# Patient Record
Sex: Female | Born: 1995 | Race: White | Hispanic: No | Marital: Single | Smoking: Never smoker
Health system: Southern US, Community
[De-identification: ages and names within clinical notes are randomized; demographics above are authoritative.]

## PROBLEM LIST (undated history)

## (undated) DIAGNOSIS — F419 Anxiety disorder, unspecified: Secondary | ICD-10-CM

## (undated) DIAGNOSIS — T7840XA Allergy, unspecified, initial encounter: Secondary | ICD-10-CM

## (undated) DIAGNOSIS — F909 Attention-deficit hyperactivity disorder, unspecified type: Secondary | ICD-10-CM

## (undated) DIAGNOSIS — F32A Depression, unspecified: Secondary | ICD-10-CM

## (undated) HISTORY — DX: Allergy, unspecified, initial encounter: T78.40XA

## (undated) HISTORY — DX: Depression, unspecified: F32.A

## (undated) HISTORY — DX: Anxiety disorder, unspecified: F41.9

## (undated) HISTORY — DX: Attention-deficit hyperactivity disorder, unspecified type: F90.9

## (undated) HISTORY — PX: TYMPANOSTOMY TUBE PLACEMENT: SHX32

---
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
## 2017-02-04 NOTE — Progress Notes (Signed)
Chief Complaint  Patient presents with  . Medication Refill    HPI   ADD Pt has a diagnosis of ADD/ADHD She is taking 5 daily and has good results She does not use other stimulants such as energy drinks or caffeine She is able to sleep at night and denies unintentional weight loss She denies mood swings She skips doses when not working or on weekends Wt Readings from Last 3 Encounters:  02/05/17 160 lb 6.4 oz (72.8 kg) (88 %, Z= 1.18)*  12/10/16 155 lb (70.3 kg) (85 %, Z= 1.05)*  09/29/16 149 lb 1.6 oz (67.6 kg) (81 %, Z= 0.89)*   * Growth percentiles are based on CDC (Girls, 2-20 Years) data.   Adjustment disorder with mixed anxiety and depressed mood She is also taking Prozac She takes prozac for anxiety She reports that her dose of 20mg  is working for her She does not exercise  She feel off her horse and crushed T8-L1 vertebrae    Past Medical History:  Diagnosis Date  . ADHD (attention deficit hyperactivity disorder)     Current Outpatient Medications  Medication Sig Dispense Refill  . FLUoxetine (PROZAC) 20 MG capsule Take 1 capsule (20 mg total) by mouth daily. 30 capsule 2  . methylphenidate (CONCERTA) 36 MG PO CR tablet Take 2 tablets (72 mg total) by mouth daily. 60 tablet 0  . fluticasone (FLONASE) 50 MCG/ACT nasal spray Place 1 spray into both nostrils daily as needed for allergies or rhinitis.     No current facility-administered medications for this visit.     Allergies: No Known Allergies  No past surgical history on file.  Social History   Socioeconomic History  . Marital status: Single    Spouse name: None  . Number of children: None  . Years of education: None  . Highest education level: None  Social Needs  . Financial resource strain: None  . Food insecurity - worry: None  . Food insecurity - inability: None  . Transportation needs - medical: None  . Transportation needs - non-medical: None  Occupational History  . None  Tobacco Use    . Smoking status: Never Smoker  . Smokeless tobacco: Never Used  Substance and Sexual Activity  . Alcohol use: No  . Drug use: No  . Sexual activity: No  Other Topics Concern  . None  Social History Narrative  . None    No family history on file.   ROS Review of Systems See HPI Constitution: No fevers or chills No malaise No diaphoresis Skin: No rash or itching Eyes: no blurry vision, no double vision GU: no dysuria or hematuria Neuro: no dizziness or headaches * all others reviewed and negative   Objective: Vitals:   02/05/17 1500 02/05/17 1533  BP: 112/72   Pulse: (!) 112 98  Resp: 16   Temp: 98.6 F (37 C)   TempSrc: Oral   SpO2: 98%   Weight: 160 lb 6.4 oz (72.8 kg)   Height: 5' 4.89" (1.648 m)     Physical Exam  Constitutional: She is oriented to person, place, and time. She appears well-developed and well-nourished.  Cardiovascular: Regular rhythm, normal heart sounds and intact distal pulses. Tachycardia present.  No murmur heard. Pulmonary/Chest: Effort normal and breath sounds normal. No stridor. No respiratory distress.  Musculoskeletal: Normal range of motion. She exhibits no edema.  Neurological: She is alert and oriented to person, place, and time.    Assessment and Plan Shalanda was seen today  for medication refill.  Diagnoses and all orders for this visit:  Attention deficit disorder (ADD) without hyperactivity - refilled meds -     methylphenidate (CONCERTA) 36 MG PO CR tablet; Take 2 tablets (72 mg total) by mouth daily.  Adjustment disorder with mixed anxiety and depressed mood Refilled meds -     FLUoxetine (PROZAC) 20 MG capsule; Take 1 capsule (20 mg total) by mouth daily.     Dakotah Heiman A Addisen Chappelle

## 2017-02-05 ENCOUNTER — Ambulatory Visit (INDEPENDENT_AMBULATORY_CARE_PROVIDER_SITE_OTHER): Payer: Managed Care, Other (non HMO) | Admitting: Family Medicine

## 2017-02-05 ENCOUNTER — Encounter: Payer: Self-pay | Admitting: Family Medicine

## 2017-02-05 VITALS — BP 112/72 | HR 98 | Temp 98.6°F | Resp 16 | Ht 64.89 in | Wt 160.4 lb

## 2017-02-05 DIAGNOSIS — F988 Other specified behavioral and emotional disorders with onset usually occurring in childhood and adolescence: Secondary | ICD-10-CM | POA: Diagnosis not present

## 2017-02-05 DIAGNOSIS — F4323 Adjustment disorder with mixed anxiety and depressed mood: Secondary | ICD-10-CM | POA: Diagnosis not present

## 2017-02-05 MED ORDER — FLUOXETINE HCL 20 MG PO CAPS: 20.0000 mg | ORAL_CAPSULE | Freq: Every day | ORAL | 2 refills | Status: DC

## 2017-02-05 MED ORDER — METHYLPHENIDATE HCL ER (OSM) 36 MG PO TBCR: 72.0000 mg | EXTENDED_RELEASE_TABLET | Freq: Every day | ORAL | 0 refills | Status: DC

## 2017-02-05 NOTE — Patient Instructions (Signed)
     IF you received an x-ray today, you will receive an invoice from [facility]. Please contact  Radiology at [phone] with questions or concerns regarding your invoice.   IF you received labwork today, you will receive an invoice from [facility]. Please contact [facility] at [phone] with questions or concerns regarding your invoice.   Our billing staff will not be able to assist you with questions regarding bills from these companies.  You will be contacted with the lab results as soon as they are available. The fastest way to get your results is to activate your My Chart account. Instructions are located on the last page of this paperwork. If you have not heard from us regarding the results in 2 weeks, please contact this office.     

## 2017-04-07 ENCOUNTER — Telehealth: Payer: Self-pay | Admitting: Family Medicine

## 2017-04-07 DIAGNOSIS — F988 Other specified behavioral and emotional disorders with onset usually occurring in childhood and adolescence: Secondary | ICD-10-CM

## 2017-04-07 NOTE — Telephone Encounter (Signed)
Routing to provider  

## 2017-04-07 NOTE — Telephone Encounter (Signed)
Copied from CRM [phone]. Topic: Quick Communication - Rx Refill/Question >> Apr 07, 2017 11:03 AM Clack, Princella PellegriniJessica D wrote: Has the patient contacted their pharmacy? No.   (Agent: If no, request that the patient contact the pharmacy for the refill.)   Preferred Pharmacy (with phone number or street name): [id number] [place] - [id number] [facility] [address] [phone]) [phone])  Pt mother Aniceto Bosslma calling requesting a med refill on the pt  methylphenidate (CONCERTA) 36 MG PO CR tablet [[id number]][[id number]]    Agent: Please be advised that RX refills may take up to 3 business days. We ask that you follow-up with your pharmacy.

## 2017-04-08 HISTORY — PX: WISDOM TOOTH EXTRACTION: SHX21

## 2017-04-09 ENCOUNTER — Other Ambulatory Visit: Payer: Self-pay

## 2017-04-09 DIAGNOSIS — F988 Other specified behavioral and emotional disorders with onset usually occurring in childhood and adolescence: Secondary | ICD-10-CM

## 2017-04-09 NOTE — Telephone Encounter (Signed)
PEC req for refill  Concerta - Sent to Dr. Creta LevinStallings

## 2017-04-09 NOTE — Telephone Encounter (Signed)
Sent to Dr. Creta LevinStallings

## 2017-04-11 MED ORDER — METHYLPHENIDATE HCL ER (OSM) 36 MG PO TBCR: 72.0000 mg | EXTENDED_RELEASE_TABLET | Freq: Every day | ORAL | 0 refills | Status: DC

## 2017-04-11 NOTE — Telephone Encounter (Signed)
Refill printed and handed to patient today

## 2017-05-03 ENCOUNTER — Other Ambulatory Visit: Payer: Self-pay | Admitting: Family Medicine

## 2017-05-08 NOTE — Progress Notes (Deleted)
  No chief complaint on file.   HPI  4 review of systems  Past Medical History:  Diagnosis Date  . ADHD (attention deficit hyperactivity disorder)     Current Outpatient Medications  Medication Sig Dispense Refill  . FLUoxetine (PROZAC) 20 MG capsule TAKE 1 CAPSULE(20 MG) BY MOUTH DAILY 90 capsule 0  . fluticasone (FLONASE) 50 MCG/ACT nasal spray Place 1 spray into both nostrils daily as needed for allergies or rhinitis.    . methylphenidate (CONCERTA) 36 MG PO CR tablet Take 2 tablets (72 mg total) by mouth daily. 60 tablet 0   No current facility-administered medications for this visit.     Allergies: No Known Allergies  No past surgical history on file.  Social History   Socioeconomic History  . Marital status: Single    Spouse name: Not on file  . Number of children: Not on file  . Years of education: Not on file  . Highest education level: Not on file  Social Needs  . Financial resource strain: Not on file  . Food insecurity - worry: Not on file  . Food insecurity - inability: Not on file  . Transportation needs - medical: Not on file  . Transportation needs - non-medical: Not on file  Occupational History  . Not on file  Tobacco Use  . Smoking status: Never Smoker  . Smokeless tobacco: Never Used  Substance and Sexual Activity  . Alcohol use: No  . Drug use: No  . Sexual activity: No  Other Topics Concern  . Not on file  Social History Narrative  . Not on file    No family history on file.   ROS Review of Systems See HPI Constitution: No fevers or chills No malaise No diaphoresis Skin: No rash or itching Eyes: no blurry vision, no double vision GU: no dysuria or hematuria Neuro: no dizziness or headaches * all others reviewed and negative   Objective: There were no vitals filed for this visit.  Physical Exam  Assessment and Plan There are no diagnoses linked to this encounter.   Delores P PPL Corporationaddy

## 2017-05-12 ENCOUNTER — Ambulatory Visit: Payer: Managed Care, Other (non HMO) | Admitting: Family Medicine

## 2017-05-14 ENCOUNTER — Other Ambulatory Visit: Payer: Self-pay

## 2017-05-14 ENCOUNTER — Encounter: Payer: Self-pay | Admitting: Family Medicine

## 2017-05-14 ENCOUNTER — Ambulatory Visit: Payer: Managed Care, Other (non HMO) | Admitting: Family Medicine

## 2017-05-14 VITALS — BP 112/72 | HR 95 | Temp 98.6°F | Resp 16 | Ht 64.9 in | Wt 161.4 lb

## 2017-05-14 DIAGNOSIS — Z23 Encounter for immunization: Secondary | ICD-10-CM | POA: Diagnosis not present

## 2017-05-14 DIAGNOSIS — F988 Other specified behavioral and emotional disorders with onset usually occurring in childhood and adolescence: Secondary | ICD-10-CM | POA: Diagnosis not present

## 2017-05-14 MED ORDER — METHYLPHENIDATE HCL ER (OSM) 36 MG PO TBCR: 72.0000 mg | EXTENDED_RELEASE_TABLET | Freq: Every day | ORAL | 0 refills | Status: DC

## 2017-05-14 NOTE — Patient Instructions (Signed)
     IF you received an x-ray today, you will receive an invoice from [facility]. Please contact  Radiology at [phone] with questions or concerns regarding your invoice.   IF you received labwork today, you will receive an invoice from [facility]. Please contact [facility] at [phone] with questions or concerns regarding your invoice.   Our billing staff will not be able to assist you with questions regarding bills from these companies.  You will be contacted with the lab results as soon as they are available. The fastest way to get your results is to activate your My Chart account. Instructions are located on the last page of this paperwork. If you have not heard from us regarding the results in 2 weeks, please contact this office.     

## 2017-05-14 NOTE — Progress Notes (Signed)
Chief Complaint  Patient presents with  . Medication Refill    concerta    HPI  ADD Pt has Anne diagnosis of ADD/ADHD She is taking Concerta 72mg  daily and has good results she does not use other stimulants such as energy drinks or caffeine shee is able to sleep at night and denies unintentional weight loss she denies mood swings she skips doses when not working or on weekends Wt Readings from Last 3 Encounters:  05/14/17 161 lb 6.4 oz (73.2 kg) (88 %, Z= 1.18)*  02/05/17 160 lb 6.4 oz (72.8 kg) (88 %, Z= 1.18)*  12/10/16 155 lb (70.3 kg) (85 %, Z= 1.05)*   * Growth percentiles are based on CDC (Girls, 2-20 Years) data.     Past Medical History:  Diagnosis Date  . ADHD (attention deficit hyperactivity disorder)     Current Outpatient Medications  Medication Sig Dispense Refill  . FLUoxetine (PROZAC) 20 MG capsule TAKE 1 CAPSULE(20 MG) BY MOUTH DAILY 90 capsule 0  . fluticasone (FLONASE) 50 MCG/ACT nasal spray Place 1 spray into both nostrils daily as needed for allergies or rhinitis.    . methylphenidate (CONCERTA) 36 MG PO CR tablet Take 2 tablets (72 mg total) by mouth daily. 60 tablet 0   No current facility-administered medications for this visit.     Allergies: No Known Allergies  No past surgical history on file.  Social History   Socioeconomic History  . Marital status: Single    Spouse name: None  . Number of children: None  . Years of education: None  . Highest education level: None  Social Needs  . Financial resource strain: None  . Food insecurity - worry: None  . Food insecurity - inability: None  . Transportation needs - medical: None  . Transportation needs - non-medical: None  Occupational History  . None  Tobacco Use  . Smoking status: Never Smoker  . Smokeless tobacco: Never Used  Substance and Sexual Activity  . Alcohol use: No  . Drug use: No  . Sexual activity: No  Other Topics Concern  . None  Social History Narrative  . None     No family history on file.   ROS Review of Systems See HPI Constitution: No fevers or chills No malaise No diaphoresis Skin: No rash or itching Eyes: no blurry vision, no double vision GU: no dysuria or hematuria Neuro: no dizziness or headaches  all others reviewed and negative   Objective: Vitals:   05/14/17 1523  BP: 112/72  Pulse: 95  Resp: 16  Temp: 98.6 F (37 C)  TempSrc: Oral  SpO2: 100%  Weight: 161 lb 6.4 oz (73.2 kg)  Height: 5' 4.9" (1.648 m)    Physical Exam   Physical Exam  Constitutional: She is oriented to person, place, and time. She appears well-developed and well-nourished.  HENT:  Head: Normocephalic and atraumatic.  Eyes: Conjunctivae and EOM are normal.  Cardiovascular: Normal rate, regular rhythm and normal heart sounds.   Pulmonary/Chest: Effort normal and breath sounds normal. No respiratory distress. She has no wheezes.  Neurological: She is alert and oriented to person, place, and time.    Assessment and Plan Anne Matthews was seen today for medication refill.  Diagnoses and all orders for this visit:  Attention deficit disorder (ADD) without hyperactivity- discussed current dosing and refill policy Usage verified in database and deemed appropriate -     methylphenidate (CONCERTA) 36 MG PO CR tablet; Take 2 tablets (72 mg total)  by mouth daily.  Need for prophylactic vaccination and inoculation against influenza -     Flu Vaccine QUAD 36+ mos IM   Follow up in 3 months for med check office visit.    Anne Matthews Anne Matthews

## 2017-06-23 ENCOUNTER — Other Ambulatory Visit: Payer: Self-pay | Admitting: Family Medicine

## 2017-06-23 DIAGNOSIS — F988 Other specified behavioral and emotional disorders with onset usually occurring in childhood and adolescence: Secondary | ICD-10-CM

## 2017-06-23 NOTE — Telephone Encounter (Signed)
Copied from CRM [phone]. Topic: Quick Communication - Rx Refill/Question >> Jun 23, 2017  1:29 PM Landry MellowFoltz, Melissa J wrote: Medication: methylphenidate (CONCERTA) 36 MG PO CR tablet   Has the patient contacted their pharmacy? No. Controlled    (Agent: If no, request that the patient contact the pharmacy for the refill.)   Preferred Pharmacy (with phone number or street name): [facility] [id number] [place] - [id number] [facility] [address] [phone] (Phone) [phone])     Agent: Please be advised that RX refills may take up to 3 business days. We ask that you follow-up with your pharmacy.

## 2017-06-24 NOTE — Telephone Encounter (Signed)
Patient is requesting a refill of the following medications: Requested Prescriptions   Pending Prescriptions Disp Refills  . methylphenidate (CONCERTA) 36 MG PO CR tablet 60 tablet 0    Sig: Take 2 tablets (72 mg total) by mouth daily.    Date of patient request: 06/24/2017 Last office visit:05/14/2017 Date of last refill:05/14/2017 Last refill amount:60 Follow up time period per chart: return in 3 months for med check office visit.

## 2017-06-24 NOTE — Telephone Encounter (Signed)
Methylphenidate LOV: 05/14/17 PCP: Dr Collie SiadZoe Matthews [facility] [address] [place]

## 2017-06-25 MED ORDER — METHYLPHENIDATE HCL ER (OSM) 36 MG PO TBCR: 72.0000 mg | EXTENDED_RELEASE_TABLET | Freq: Every day | ORAL | 0 refills | Status: DC

## 2017-06-25 NOTE — Telephone Encounter (Signed)
Dr stallings please advise. Dgaddy, CMA 

## 2017-07-29 ENCOUNTER — Other Ambulatory Visit: Payer: Self-pay | Admitting: Family Medicine

## 2017-07-29 DIAGNOSIS — F988 Other specified behavioral and emotional disorders with onset usually occurring in childhood and adolescence: Secondary | ICD-10-CM

## 2017-07-29 NOTE — Telephone Encounter (Signed)
Copied from CRM [id number]. Topic: Quick Communication - Rx Refill/Question >> Jul 29, 2017 10:07 AM Arlyss Gandyichardson, Manuela Halbur N, NT wrote: Medication: methylphenidate (CONCERTA) 36 MG  Has the patient contacted their pharmacy? Yes.   (Agent: If no, request that the patient contact the pharmacy for the refill.) Preferred Pharmacy (with phone number or street name): [facility] [id number] [place] - [id number] [facility] [address] [phone] (Phone) [phone])     Agent: Please be advised that RX refills may take up to 3 business days. We ask that you follow-up with your pharmacy.

## 2017-07-30 MED ORDER — METHYLPHENIDATE HCL ER (OSM) 36 MG PO TBCR: 72.0000 mg | EXTENDED_RELEASE_TABLET | Freq: Every day | ORAL | 0 refills | Status: DC

## 2017-07-30 NOTE — Telephone Encounter (Signed)
Please advise. Dgaddy, CMA 

## 2017-07-30 NOTE — Telephone Encounter (Signed)
Concerta refill request  LOV 05/14/17 with Dr. Creta LevinStallings  Last refill:  06/25/17  #60  [id number] [place] - [address]

## 2017-07-31 ENCOUNTER — Telehealth: Payer: Self-pay

## 2017-07-31 NOTE — Telephone Encounter (Signed)
Done on 4/24  Copied from CRM [id number]. Topic: Quick Communication - Rx Refill/Question >> Jul 29, 2017 10:07 AM Arlyss Gandyichardson, [name], NT wrote: Medication: methylphenidate (CONCERTA) 36 MG  Has the patient contacted their pharmacy? Yes.   (Agent: If no, request that the patient contact the pharmacy for the refill.) Preferred Pharmacy (with phone number or street name): [facility] [id number] [place], [place] - [address] AT [facility] [address] [phone] (Phone) [phone])     Agent: Please be advised that RX refills may take up to 3 business days. We ask that you follow-up with your pharmacy. >> Jul 31, 2017 11:21 AM Floria RavelingStovall, [name] wrote: Pt mother called in and stated that pt is needing this refill asap.  She is in the middle of exams and needs this to beable to take exams  Best number  [phone]

## 2017-08-04 MED ORDER — METHYLPHENIDATE HCL ER (OSM) 36 MG PO TBCR: 72.0000 mg | EXTENDED_RELEASE_TABLET | Freq: Every day | ORAL | 0 refills | Status: DC

## 2017-08-04 NOTE — Telephone Encounter (Signed)
The prescription was not received at the pharmacy.  Patient and parent came in for paper script Reviewed [place] and no refill was made since 06/25/17

## 2017-08-04 NOTE — Addendum Note (Signed)
Addended by: Collie Siad A on: 08/04/2017 09:20 AM   Modules accepted: Orders

## 2017-08-15 ENCOUNTER — Other Ambulatory Visit: Payer: Self-pay | Admitting: Family Medicine

## 2017-08-15 NOTE — Telephone Encounter (Signed)
Patient is requesting a refill of the following medications: Requested Prescriptions   Pending Prescriptions Disp Refills  . FLUoxetine (PROZAC) 20 MG capsule [Pharmacy Med Name: FLUOXETINE  CAPSULES] 90 capsule 0    Sig: TAKE 1 CAPSULE(20 MG) BY MOUTH DAILY     Date of patient request:08/15/2017 Last office visit:05/14/2017 Date of last refill:05/04/2017 Last refill amount: 90 Follow up time period per chart: N/A  Please advise. Dgaddy, CMA

## 2017-09-05 ENCOUNTER — Other Ambulatory Visit: Payer: Self-pay | Admitting: Family Medicine

## 2017-09-05 DIAGNOSIS — F988 Other specified behavioral and emotional disorders with onset usually occurring in childhood and adolescence: Secondary | ICD-10-CM

## 2017-09-05 NOTE — Telephone Encounter (Signed)
Copied from CRM [id number]. Topic: Quick Communication - Rx Refill/Question >> Sep 05, 2017  2:37 PM Oneal GroutSebastian, Jennifer S wrote: Medication: methylphenidate (CONCERTA) 36 MG PO CR tablet   Has the patient contacted their pharmacy? Yes (Agent: If no, request that the patient contact the pharmacy for the refill.) (Agent: If yes, when and what did the pharmacy advise?)  Preferred Pharmacy (with phone number or street name): [facility] [address]  Agent: Please be advised that RX refills may take up to 3 business days. We ask that you follow-up with your pharmacy.

## 2017-09-08 NOTE — Telephone Encounter (Signed)
Rx refill request: Concerta 36 mg     Last filled : 08/04/17  # 60  LOV: 05/14/17  PCP: Creta LevinStallings  [facility]

## 2017-09-08 NOTE — Telephone Encounter (Signed)
Patient is requesting a refill of the following medications: Requested Prescriptions   Pending Prescriptions Disp Refills  . methylphenidate (CONCERTA) 36 MG PO CR tablet 60 tablet 0    Sig: Take 2 tablets (72 mg total) by mouth daily.    Date of patient request: 09/08/17 Last office visit: 05/14/17 Date of last refill: 08/04/08 Last refill amount: #60 Follow up time period per chart: n/a dgaddy

## 2017-09-09 MED ORDER — METHYLPHENIDATE HCL ER (OSM) 36 MG PO TBCR: 72.0000 mg | EXTENDED_RELEASE_TABLET | Freq: Every day | ORAL | 0 refills | Status: DC

## 2017-09-17 ENCOUNTER — Other Ambulatory Visit: Payer: Self-pay | Admitting: Family Medicine

## 2017-09-17 DIAGNOSIS — F988 Other specified behavioral and emotional disorders with onset usually occurring in childhood and adolescence: Secondary | ICD-10-CM

## 2017-09-17 MED ORDER — METHYLPHENIDATE HCL ER (OSM) 36 MG PO TBCR
72.0000 mg | EXTENDED_RELEASE_TABLET | Freq: Every day | ORAL | 0 refills | Status: DC
Start: 2017-09-17 — End: 2017-10-22

## 2017-10-21 ENCOUNTER — Other Ambulatory Visit: Payer: Self-pay | Admitting: Family Medicine

## 2017-10-21 DIAGNOSIS — F988 Other specified behavioral and emotional disorders with onset usually occurring in childhood and adolescence: Secondary | ICD-10-CM

## 2017-10-21 NOTE — Telephone Encounter (Signed)
Copied from CRM [id number]. Topic: Quick Communication - Rx Refill/Question >> Oct 21, 2017  3:37 PM Burchel, Abbi R wrote: Medication: methylphenidate (CONCERTA) 36 MG PO CR tablet   [id number] [place] - [address] AT [facility] [address]  [phone] (Phone) [phone] (Fax)  Pt: [phone]

## 2017-10-22 MED ORDER — METHYLPHENIDATE HCL ER (OSM) 36 MG PO TBCR: 72.0000 mg | EXTENDED_RELEASE_TABLET | Freq: Every day | ORAL | 0 refills | Status: DC

## 2017-10-22 NOTE — Telephone Encounter (Signed)
Methyphenidate (Concerta) refill Last Refill:09/17/17 #60 Last OV: 05/14/17 PCP: Dr. Creta LevinStallings [facility]acy[facility] [address]

## 2017-12-01 ENCOUNTER — Other Ambulatory Visit: Payer: Self-pay | Admitting: Family Medicine

## 2017-12-01 DIAGNOSIS — F988 Other specified behavioral and emotional disorders with onset usually occurring in childhood and adolescence: Secondary | ICD-10-CM

## 2017-12-01 MED ORDER — METHYLPHENIDATE HCL ER (OSM) 36 MG PO TBCR: 72.0000 mg | EXTENDED_RELEASE_TABLET | Freq: Every day | ORAL | 0 refills | Status: DC

## 2017-12-01 NOTE — Progress Notes (Signed)
Refill of Concerta

## 2018-01-02 ENCOUNTER — Telehealth: Payer: Self-pay | Admitting: Family Medicine

## 2018-01-02 NOTE — Telephone Encounter (Signed)
Pt mom dropped off a note for Mrs. Deloris and placed it in Dr. Creta Levin box. The note is concerning a prescription.

## 2018-01-06 ENCOUNTER — Telehealth: Payer: Self-pay | Admitting: Family Medicine

## 2018-01-06 ENCOUNTER — Other Ambulatory Visit: Payer: Self-pay | Admitting: Family Medicine

## 2018-01-06 DIAGNOSIS — F988 Other specified behavioral and emotional disorders with onset usually occurring in childhood and adolescence: Secondary | ICD-10-CM

## 2018-01-06 NOTE — Telephone Encounter (Signed)
Pt had dropped off note on 01/02/2018 it was placed in provider box. Pt Mom stopped by today to follow up n0 response on refill request for generic for Concerta. Need refill asao !

## 2018-01-07 ENCOUNTER — Other Ambulatory Visit: Payer: Self-pay | Admitting: Family Medicine

## 2018-01-07 ENCOUNTER — Telehealth: Payer: Self-pay

## 2018-01-07 DIAGNOSIS — F988 Other specified behavioral and emotional disorders with onset usually occurring in childhood and adolescence: Secondary | ICD-10-CM

## 2018-01-07 MED ORDER — METHYLPHENIDATE HCL ER (OSM) 36 MG PO TBCR
72.0000 mg | EXTENDED_RELEASE_TABLET | Freq: Every day | ORAL | 0 refills | Status: DC
Start: 2018-01-07 — End: 2018-02-09

## 2018-01-07 NOTE — Telephone Encounter (Signed)
Spoke with mom an advised methylphenidate 36 mg sent [facility] [address].  Mom appreciative of call back. Dgaddy, CMA

## 2018-01-07 NOTE — Telephone Encounter (Signed)
Refill sent over to pharmacy on 01/07/18 and mom notifed via phone. Dgaddy, CMA

## 2018-01-07 NOTE — Telephone Encounter (Signed)
Concerta 36 mg rx sent over to [facility] and mom notified via phone rx sent over. Dgaddy, CMA

## 2018-02-05 ENCOUNTER — Other Ambulatory Visit: Payer: Self-pay | Admitting: Family Medicine

## 2018-02-05 DIAGNOSIS — F988 Other specified behavioral and emotional disorders with onset usually occurring in childhood and adolescence: Secondary | ICD-10-CM

## 2018-02-09 ENCOUNTER — Other Ambulatory Visit: Payer: Self-pay | Admitting: Family Medicine

## 2018-02-09 DIAGNOSIS — F988 Other specified behavioral and emotional disorders with onset usually occurring in childhood and adolescence: Secondary | ICD-10-CM

## 2018-02-10 MED ORDER — METHYLPHENIDATE HCL ER (OSM) 36 MG PO TBCR: 72.0000 mg | EXTENDED_RELEASE_TABLET | Freq: Every day | ORAL | 0 refills | Status: DC

## 2018-02-13 ENCOUNTER — Other Ambulatory Visit: Payer: Self-pay | Admitting: Family Medicine

## 2018-02-13 NOTE — Telephone Encounter (Signed)
Called pt and spoke to her mother (on [place]) Py is at school.  Mother states pt will call for appointment when she returns home and states that she has medication to last until then.

## 2018-02-13 NOTE — Telephone Encounter (Signed)
Called and left message to call and make an appointment.

## 2018-03-16 ENCOUNTER — Other Ambulatory Visit: Payer: Self-pay | Admitting: Family Medicine

## 2018-03-16 DIAGNOSIS — F988 Other specified behavioral and emotional disorders with onset usually occurring in childhood and adolescence: Secondary | ICD-10-CM

## 2018-03-17 NOTE — Telephone Encounter (Signed)
Please advise 

## 2018-03-19 ENCOUNTER — Other Ambulatory Visit: Payer: Self-pay | Admitting: Family Medicine

## 2018-03-19 DIAGNOSIS — F988 Other specified behavioral and emotional disorders with onset usually occurring in childhood and adolescence: Secondary | ICD-10-CM

## 2018-03-20 MED ORDER — METHYLPHENIDATE HCL ER (OSM) 36 MG PO TBCR: 72.0000 mg | EXTENDED_RELEASE_TABLET | Freq: Every day | ORAL | 0 refills | Status: DC

## 2018-04-24 ENCOUNTER — Encounter: Payer: Self-pay | Admitting: Family Medicine

## 2018-04-24 ENCOUNTER — Other Ambulatory Visit: Payer: Self-pay

## 2018-04-24 ENCOUNTER — Ambulatory Visit (INDEPENDENT_AMBULATORY_CARE_PROVIDER_SITE_OTHER): Payer: 59 | Admitting: Family Medicine

## 2018-04-24 VITALS — BP 128/73 | HR 92 | Temp 98.5°F | Resp 20 | Ht 64.45 in | Wt 164.0 lb

## 2018-04-24 DIAGNOSIS — F988 Other specified behavioral and emotional disorders with onset usually occurring in childhood and adolescence: Secondary | ICD-10-CM

## 2018-04-24 DIAGNOSIS — Z23 Encounter for immunization: Secondary | ICD-10-CM | POA: Diagnosis not present

## 2018-04-24 DIAGNOSIS — F4323 Adjustment disorder with mixed anxiety and depressed mood: Secondary | ICD-10-CM

## 2018-04-24 MED ORDER — FLUOXETINE HCL 20 MG PO CAPS: ORAL_CAPSULE | ORAL | 3 refills | Status: DC

## 2018-04-24 MED ORDER — METHYLPHENIDATE HCL ER (OSM) 36 MG PO TBCR: 72.0000 mg | EXTENDED_RELEASE_TABLET | Freq: Every day | ORAL | 0 refills | Status: DC

## 2018-04-24 NOTE — Patient Instructions (Signed)
° ° ° °  If you have lab work done today you will be contacted with your lab results within the next 2 weeks.  If you have not heard from us then please contact us. The fastest way to get your results is to register for My Chart. ° ° °IF you received an x-ray today, you will receive an invoice from [facility]. Please contact [facility] at [phone] with questions or concerns regarding your invoice.  ° °IF you received labwork today, you will receive an invoice from [facility]. Please contact [facility] at [phone] with questions or concerns regarding your invoice.  ° °Our billing staff will not be able to assist you with questions regarding bills from these companies. ° °You will be contacted with the lab results as soon as they are available. The fastest way to get your results is to activate your My Chart account. Instructions are located on the last page of this paperwork. If you have not heard from us regarding the results in 2 weeks, please contact this office. °  ° ° ° °

## 2018-04-24 NOTE — Progress Notes (Signed)
Established Patient Office Visit  Subjective:  Patient ID: Anne Matthews, female    DOB: 1995/12/29  Age: 23 y.o. MRN: [id number]  CC:  Chief Complaint  Patient presents with  . Medication Refill    prozac and concerta  . Depression    screening done  . 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 She is stable on her current doses  ADD Pt has a diagnosis of ADD/ADHD Pt is taking Concerta daily and has good results Pt does not use other stimulants such as energy drinks or caffeine Pt is able to sleep at night and denies unintentional weight loss Pt  denies mood swings Pt skips doses when not working or on weekends   Depression screen [facility] 2/9 04/24/2018 05/14/2017 02/05/2017 12/10/2016 06/21/2016  Decreased Interest 0 0 0 0 2  Down, Depressed, Hopeless 1 0 0 0 2  PHQ - 2 Score 1 0 0 0 4  Altered sleeping 0 - - - 2  Tired, decreased energy 0 - - - 1  Change in appetite 0 - - - 0  Feeling bad or failure about yourself  1 - - - 3  Trouble concentrating 0 - - - 0  Moving slowly or fidgety/restless 0 - - - 0  Suicidal thoughts 1 - - - 2  PHQ-9 Score 3 - - - 12  Difficult doing work/chores Somewhat difficult - - - -   GAD 7 : Generalized Anxiety Score 04/24/2018  Nervous, Anxious, on Edge 1  Control/stop worrying 1  Worry too much - different things 1  Trouble relaxing 1  Restless 1  Easily annoyed or irritable 1  Afraid - awful might happen 1  Total GAD 7 Score 7  Anxiety Difficulty Somewhat difficult      Past Medical History:  Diagnosis Date  . ADHD (attention deficit hyperactivity  disorder)     History reviewed. No pertinent surgical history.  History reviewed. No pertinent family history.  Social History   Socioeconomic History  . Marital status: Single    Spouse name: Not on file  . Number of children: Not on file  . Years of education: Not on file  . Highest education level: Not on file  Occupational History  . Not on file  Social Needs  . Financial resource strain: Not on file  . Food insecurity:    Worry: Not on file    Inability: Not on file  . Transportation needs:    Medical: Not on file    Non-medical: Not on file  Tobacco Use  . Smoking status: Never Smoker  . Smokeless tobacco: Never Used  Substance and Sexual Activity  . Alcohol use: No  . Drug use: No  . Sexual activity: Never  Lifestyle  . Physical activity:    Days per week: Not on file    Minutes per session: Not on file  . Stress: Not on file  Relationships  . Social connections:    Talks on phone: Not on file    Gets together: Not on file  Attends religious service: Not on file    Active member of club or organization: Not on file    Attends meetings of clubs or organizations: Not on file    Relationship status: Not on file  . Intimate partner violence:    Fear of current or ex partner: Not on file    Emotionally abused: Not on file    Physically abused: Not on file    Forced sexual activity: Not on file  Other Topics Concern  . Not on file  Social History Narrative  . Not on file    Outpatient Medications Prior to Visit  Medication Sig Dispense Refill  . fluticasone (FLONASE) 50 MCG/ACT nasal spray Place 1 spray into both nostrils daily as needed for allergies or rhinitis.    Marland Kitchen. FLUoxetine (PROZAC) 20 MG capsule TAKE 1 CAPSULE(20 MG) BY MOUTH DAILY 90 capsule 1  . methylphenidate (CONCERTA) 36 MG PO CR tablet Take 2 tablets (72 mg total) by mouth daily. 60 tablet 0   No facility-administered medications prior to visit.     No Known Allergies  ROS Review of  Systems   No tremors or panic attacks No dizziness    Objective:    Physical Exam  BP 128/73   Pulse 92   Temp 98.5 F (36.9 C) (Oral)   Resp 20   Ht 5' 4.45" (1.637 m)   Wt 164 lb (74.4 kg)   LMP 04/20/2018   SpO2 99%   BMI 27.76 kg/m  Wt Readings from Last 3 Encounters:  04/24/18 164 lb (74.4 kg)  05/14/17 161 lb 6.4 oz (73.2 kg) (88 %, Z= 1.18)*  02/05/17 160 lb 6.4 oz (72.8 kg) (88 %, Z= 1.18)*   * Growth percentiles are based on CDC (Girls, 2-20 Years) data.   Physical Exam  Constitutional: Oriented to person, place, and time. Appears well-developed and well-nourished.  HENT:  Head: Normocephalic and atraumatic.  Eyes: Conjunctivae and EOM are normal.  Cardiovascular: Normal rate, regular rhythm, normal heart sounds and intact distal pulses.  No murmur heard. Pulmonary/Chest: Effort normal and breath sounds normal. No stridor. No respiratory distress. Has no wheezes.  Neurological: Is alert and oriented to person, place, and time.  Skin: Skin is warm. Capillary refill takes less than 2 seconds.  Psychiatric: Has a normal mood and affect. Behavior is normal. Judgment and thought content normal.    Health Maintenance Due  Topic Date Due  . TETANUS/TDAP  10/13/2016  . INFLUENZA VACCINE  11/06/2017    There are no preventive care reminders to display for this patient.  No results found for: TSH Lab Results  Component Value Date   WBC 15.1 (H) 09/29/2016   HGB 13.5 09/29/2016   HCT 40.0 09/29/2016   MCV 88.5 09/29/2016   PLT 282 09/29/2016   Lab Results  Component Value Date   NA 138 09/29/2016   K 3.7 09/29/2016   CO2 21 (L) 09/29/2016   GLUCOSE 99 09/29/2016   BUN 12 09/29/2016   CREATININE 0.62 09/29/2016   CALCIUM 9.8 09/29/2016   ANIONGAP 9 09/29/2016   No results found for: CHOL No results found for: HDL No results found for: LDLCALC No results found for: TRIG No results found for: CHOLHDL No results found for: ZOXW9UHGBA1C    Assessment &  Plan:   Problem List Items Addressed This Visit      Other   Attention deficit disorder - Primary   Relevant Medications   methylphenidate (CONCERTA) 36 MG  PO CR tablet    Other Visit Diagnoses    Adjustment disorder with mixed anxiety and depressed mood         Next office visit will be in April to ensure she gets her 60 days supply She is stable on her ADD meds  Meds ordered this encounter  Medications  . methylphenidate (CONCERTA) 36 MG PO CR tablet    Sig: Take 2 tablets (72 mg total) by mouth daily.    Dispense:  60 tablet    Refill:  0  . FLUoxetine (PROZAC) 20 MG capsule    Sig: TAKE 1 CAPSULE(20 MG) BY MOUTH DAILY    Dispense:  90 capsule    Refill:  3    Follow-up: Return in about 6 months (around 10/23/2018) for medication monitoring .    Doristine BosworthZoe A Bransen Fassnacht, MD

## 2018-04-24 NOTE — Addendum Note (Signed)
Addended by: Collie Siad A on: 04/24/2018 11:17 AM   Modules accepted: Orders

## 2018-05-24 ENCOUNTER — Other Ambulatory Visit: Payer: Self-pay | Admitting: Family Medicine

## 2018-05-24 DIAGNOSIS — F988 Other specified behavioral and emotional disorders with onset usually occurring in childhood and adolescence: Secondary | ICD-10-CM

## 2018-05-26 MED ORDER — METHYLPHENIDATE HCL ER (OSM) 36 MG PO TBCR: 72.0000 mg | EXTENDED_RELEASE_TABLET | Freq: Every day | ORAL | 0 refills | Status: DC

## 2018-06-30 ENCOUNTER — Other Ambulatory Visit: Payer: Self-pay | Admitting: Family Medicine

## 2018-06-30 DIAGNOSIS — F988 Other specified behavioral and emotional disorders with onset usually occurring in childhood and adolescence: Secondary | ICD-10-CM

## 2018-07-01 MED ORDER — METHYLPHENIDATE HCL ER (OSM) 36 MG PO TBCR: 72.0000 mg | EXTENDED_RELEASE_TABLET | Freq: Every day | ORAL | 0 refills | Status: DC

## 2018-07-01 NOTE — Telephone Encounter (Signed)
Patient is requesting a refill of the following medications: Requested Prescriptions   Pending Prescriptions Disp Refills  . methylphenidate (CONCERTA) 36 MG PO CR tablet 60 tablet 0    Sig: Take 2 tablets (72 mg total) by mouth daily.    Date of patient request: 06/30/2018 Last office visit: 0/17/2020 Date of last refill: 05/26/2018 Last refill amount:60  Follow up time period per chart:

## 2018-07-23 ENCOUNTER — Telehealth (INDEPENDENT_AMBULATORY_CARE_PROVIDER_SITE_OTHER): Payer: 59 | Admitting: Family Medicine

## 2018-07-23 ENCOUNTER — Other Ambulatory Visit: Payer: Self-pay

## 2018-07-23 DIAGNOSIS — F988 Other specified behavioral and emotional disorders with onset usually occurring in childhood and adolescence: Secondary | ICD-10-CM

## 2018-07-23 MED ORDER — METHYLPHENIDATE HCL ER (OSM) 36 MG PO TBCR: 72.0000 mg | EXTENDED_RELEASE_TABLET | Freq: Every day | ORAL | 0 refills | Status: DC

## 2018-07-23 NOTE — Progress Notes (Signed)
Telemedicine Encounter- SOAP NOTE Established Patient  This telephone encounter was conducted with the patient's (or proxy's) verbal consent via audio telecommunications: yes/no: Yes Patient was instructed to have this encounter in a suitably private space; and to only have persons present to whom they give permission to participate. In addition, patient identity was confirmed by use of name plus two identifiers (DOB and address).  I discussed the limitations, risks, security and privacy concerns of performing an evaluation and management service by telephone and the availability of in person appointments. I also discussed with the patient that there may be a patient responsible charge related to this service. The patient expressed understanding and agreed to proceed.  I spent a total of TIME; 0 MIN TO 60 MIN: 15 minutes talking with the patient or their proxy.  CC:  concerta refill, add  Subjective   Anne Matthews is a 23 y.o. established patient. Telephone visit today for  HPI  ADD Pt has a diagnosis of ADD Pt is taking Concerta 72mg  daily and has good results Pt does not use other stimulants such as energy drinks or caffeine Pt is able to sleep at night and denies unintentional weight loss Pt  denies mood swings Pt skips doses when not working or on weekends   Patient Active Problem List   Diagnosis Date Noted  . Acute bronchitis 12/10/2016  . Cough 12/10/2016  . L1 vertebral fracture (HCC) 09/29/2016  . 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 Attention deficit disorder 12/08/2011    Past Medical History:  Diagnosis Date  . ADHD (attention deficit hyperactivity disorder)     Current Outpatient Medications  Medication Sig Dispense Refill  . FLUoxetine (PROZAC) 20 MG capsule TAKE 1 CAPSULE(20 MG) BY MOUTH DAILY 90 capsule 3  . fluticasone (FLONASE) 50 MCG/ACT nasal spray Place 1 spray into both nostrils daily as needed for allergies or rhinitis.    Melene Muller.  [START ON 07/30/2018] methylphenidate (CONCERTA) 36 MG PO CR tablet Take 2 tablets (72 mg total) by mouth daily. 60 tablet 0   No current facility-administered medications for this visit.     No Known Allergies  Social History   Socioeconomic History  . Marital status: Single    Spouse name: Not on file  . Number of children: Not on file  . Years of education: Not on file  . Highest education level: Not on file  Occupational History  . Not on file  Social Needs  . Financial resource strain: Not on file  . Food insecurity:    Worry: Not on file    Inability: Not on file  . Transportation needs:    Medical: Not on file    Non-medical: Not on file  Tobacco Use  . Smoking status: Never Smoker  . Smokeless tobacco: Never Used  Substance and Sexual Activity  . Alcohol use: No  . Drug use: No  . Sexual activity: Never  Lifestyle  . Physical activity:    Days per week: Not on file    Minutes per session: Not on file  . Stress: Not on file  Relationships  . Social connections:    Talks on phone: Not on file    Gets together: Not on file    Attends religious service: Not on file    Active member of club or organization: Not on file    Attends meetings of clubs or organizations: Not on file    Relationship status: Not on file  . Intimate  partner violence:    Fear of current or ex partner: Not on file    Emotionally abused: Not on file    Physically abused: Not on file    Forced sexual activity: Not on file  Other Topics Concern  . Not on file  Social History Narrative  . Not on file    ROS Review of Systems  Constitutional: Negative for activity change, appetite change, chills and fever.  HENT: Negative for congestion, nosebleeds, trouble swallowing and voice change.   Respiratory: Negative for cough, shortness of breath and wheezing.   Gastrointestinal: Negative for diarrhea, nausea and vomiting.  Genitourinary: Negative for difficulty urinating, dysuria, flank pain  and hematuria.  Musculoskeletal: Negative for back pain, joint swelling and neck pain.  Neurological: Negative for dizziness, speech difficulty, light-headedness and numbness.  See HPI. All other review of systems negative.   Objective   Vitals as reported by the patient: There were no vitals filed for this visit.  Diagnoses and all orders for this visit:  Attention deficit disorder (ADD) without hyperactivity -     methylphenidate (CONCERTA) 36 MG PO CR tablet; Take 2 tablets (72 mg total) by mouth daily.  Other orders -     Cancel: Comprehensive metabolic panel; Future  PMP aware reviewed Pt stable on current dose with mood swings, anorexia or unintentional weight loss No abberrant use noted  Reviewed common adverse reactions     I discussed the assessment and treatment plan with the patient. The patient was provided an opportunity to ask questions and all were answered. The patient agreed with the plan and demonstrated an understanding of the instructions.   The patient was advised to call back or seek an in-person evaluation if the symptoms worsen or if the condition fails to improve as anticipated.  I provided 15 minutes of non-face-to-face time during this encounter.  Doristine Bosworth, MD  [facility]

## 2018-07-23 NOTE — Progress Notes (Signed)
CC: Medication monitoring.  Per pt she will need refill on Concerta in a couple of days.  Per pt no concerns for the provider today.  No travel outside of the [place] or [place] in the past 3 weeks.

## 2018-07-23 NOTE — Patient Instructions (Signed)
° ° ° °  If you have lab work done today you will be contacted with your lab results within the next 2 weeks.  If you have not heard from us then please contact us. The fastest way to get your results is to register for My Chart. ° ° °IF you received an x-ray today, you will receive an invoice from [facility]. Please contact [facility] at [phone] with questions or concerns regarding your invoice.  ° °IF you received labwork today, you will receive an invoice from [facility]. Please contact [facility] at [phone] with questions or concerns regarding your invoice.  ° °Our billing staff will not be able to assist you with questions regarding bills from these companies. ° °You will be contacted with the lab results as soon as they are available. The fastest way to get your results is to activate your My Chart account. Instructions are located on the last page of this paperwork. If you have not heard from us regarding the results in 2 weeks, please contact this office. °  ° ° ° °

## 2018-09-16 ENCOUNTER — Other Ambulatory Visit: Payer: Self-pay | Admitting: Family Medicine

## 2018-09-16 DIAGNOSIS — F988 Other specified behavioral and emotional disorders with onset usually occurring in childhood and adolescence: Secondary | ICD-10-CM

## 2018-09-16 MED ORDER — METHYLPHENIDATE HCL ER (OSM) 36 MG PO TBCR: 72.0000 mg | EXTENDED_RELEASE_TABLET | Freq: Every day | ORAL | 0 refills | Status: DC

## 2018-09-16 NOTE — Telephone Encounter (Signed)
Please advise on medication. Was recently seen 07/2018. Controlled.

## 2018-11-08 ENCOUNTER — Other Ambulatory Visit: Payer: Self-pay | Admitting: Family Medicine

## 2018-11-08 DIAGNOSIS — F988 Other specified behavioral and emotional disorders with onset usually occurring in childhood and adolescence: Secondary | ICD-10-CM

## 2018-11-09 NOTE — Telephone Encounter (Signed)
Please advise on med refill   Controlled  Last seen was Telemed on 07/23/18 No future appt  Last filled written was 09/16/18

## 2018-11-10 MED ORDER — METHYLPHENIDATE HCL ER (OSM) 36 MG PO TBCR: 72.0000 mg | EXTENDED_RELEASE_TABLET | Freq: Every day | ORAL | 0 refills | Status: DC

## 2018-12-10 ENCOUNTER — Other Ambulatory Visit: Payer: Self-pay | Admitting: Family Medicine

## 2018-12-10 DIAGNOSIS — F988 Other specified behavioral and emotional disorders with onset usually occurring in childhood and adolescence: Secondary | ICD-10-CM

## 2018-12-15 NOTE — Telephone Encounter (Signed)
Dr. Bridget Hartshorn pt is requesting medis

## 2018-12-16 MED ORDER — METHYLPHENIDATE HCL ER (OSM) 36 MG PO TBCR: 72.0000 mg | EXTENDED_RELEASE_TABLET | Freq: Every day | ORAL | 0 refills | Status: DC

## 2018-12-16 NOTE — Telephone Encounter (Signed)
pmp reviewd, appropriate meds refilled 

## 2019-01-14 ENCOUNTER — Telehealth: Payer: Self-pay

## 2019-01-14 DIAGNOSIS — F988 Other specified behavioral and emotional disorders with onset usually occurring in childhood and adolescence: Secondary | ICD-10-CM

## 2019-01-14 NOTE — Telephone Encounter (Signed)
Pt. Came into office to schedule visit for med refills with Dr. Nolon Rod. Appt. Is set for 02/08/2019. Pt requested that the medication Concerta be refilled till the day of the appt. As she has around 7 days of medication left.

## 2019-01-15 NOTE — Telephone Encounter (Signed)
Spoke with pt and appointment has been confirmed, she would like to know if a refill could be sent?

## 2019-01-18 ENCOUNTER — Other Ambulatory Visit: Payer: Self-pay

## 2019-01-18 DIAGNOSIS — Z20822 Contact with and (suspected) exposure to covid-19: Secondary | ICD-10-CM

## 2019-01-19 LAB — NOVEL CORONAVIRUS, NAA: SARS-CoV-2, NAA: NOT DETECTED

## 2019-01-19 MED ORDER — METHYLPHENIDATE HCL ER (OSM) 36 MG PO TBCR: 72.0000 mg | EXTENDED_RELEASE_TABLET | Freq: Every day | ORAL | 0 refills | Status: DC

## 2019-01-19 NOTE — Telephone Encounter (Signed)
Please notify patient of refill

## 2019-01-19 NOTE — Telephone Encounter (Signed)
Patient notified

## 2019-02-08 ENCOUNTER — Other Ambulatory Visit: Payer: Self-pay

## 2019-02-08 ENCOUNTER — Encounter: Payer: Self-pay | Admitting: Family Medicine

## 2019-02-08 ENCOUNTER — Ambulatory Visit: Payer: 59 | Admitting: Family Medicine

## 2019-02-08 VITALS — BP 123/70 | HR 113 | Temp 98.4°F | Ht 64.0 in | Wt 167.4 lb

## 2019-02-08 DIAGNOSIS — R000 Tachycardia, unspecified: Secondary | ICD-10-CM | POA: Diagnosis not present

## 2019-02-08 DIAGNOSIS — Z5181 Encounter for therapeutic drug level monitoring: Secondary | ICD-10-CM

## 2019-02-08 DIAGNOSIS — F988 Other specified behavioral and emotional disorders with onset usually occurring in childhood and adolescence: Secondary | ICD-10-CM

## 2019-02-08 MED ORDER — METHYLPHENIDATE HCL ER (OSM) 36 MG PO TBCR: 72.0000 mg | EXTENDED_RELEASE_TABLET | Freq: Every day | ORAL | 0 refills | Status: DC

## 2019-02-08 NOTE — Patient Instructions (Signed)
° ° ° °  If you have lab work done today you will be contacted with your lab results within the next 2 weeks.  If you have not heard from us then please contact us. The fastest way to get your results is to register for My Chart. ° ° °IF you received an x-ray today, you will receive an invoice from [facility]. Please contact [facility] at [phone] with questions or concerns regarding your invoice.  ° °IF you received labwork today, you will receive an invoice from [facility]. Please contact [facility] at [phone] with questions or concerns regarding your invoice.  ° °Our billing staff will not be able to assist you with questions regarding bills from these companies. ° °You will be contacted with the lab results as soon as they are available. The fastest way to get your results is to activate your My Chart account. Instructions are located on the last page of this paperwork. If you have not heard from us regarding the results in 2 weeks, please contact this office. °  ° ° ° °

## 2019-02-08 NOTE — Progress Notes (Signed)
Established Patient Office Visit  Subjective:  Patient ID: Anne Matthews, female    DOB: June 25, 1995  Age: 23 y.o. MRN: [id number]  CC:  Chief Complaint  Patient presents with  . Medication Refill    Concerta   . 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    Past Medical History:  Diagnosis Date  . ADHD (attention deficit hyperactivity disorder)     No past surgical history on file.  No family history on file.  Social History   Socioeconomic History  . Marital status: Single    Spouse name: Not on file  . Number of children: Not on file  . Years of education: Not on file  . Highest education level: Not on file  Occupational History  . Not on file  Social Needs  . Financial resource strain: Not on file  . Food insecurity    Worry: Not on file    Inability: Not on file  . Transportation needs    Medical: Not on file    Non-medical: Not on file  Tobacco Use  . Smoking status: Never Smoker  . Smokeless tobacco: Never Used  Substance and Sexual Activity  . Alcohol use: No  . Drug use: No  . Sexual activity: Never  Lifestyle  . Physical activity    Days per week: Not on file    Minutes per session: Not on file  . Stress: Not on file  Relationships  . Social 14/01/2019 on phone: Not on file    Gets together: Not on file    Attends religious service: Not on file    Active member of club or organization: Not on file    Attends meetings of clubs or organizations: Not on file    Relationship status: Not  on file  . Intimate partner violence    Fear of current or ex partner: Not on file    Emotionally abused: Not on file    Physically abused: Not on file    Forced sexual activity: Not on file  Other Topics Concern  . Not on file  Social History Narrative  . Not on file    Outpatient Medications Prior to Visit  Medication Sig Dispense Refill  . FLUoxetine (PROZAC) 20 MG capsule TAKE 1 CAPSULE(20 MG) BY MOUTH DAILY 90 capsule 3  . fluticasone (FLONASE) 50 MCG/ACT nasal spray Place 1 spray into both nostrils daily as needed for allergies or rhinitis.    . methylphenidate (CONCERTA) 36 MG PO CR tablet Take 2 tablets (72 mg total) by mouth daily. 60 tablet 0   No facility-administered medications prior to visit.     No Known Allergies  ROS Review of Systems Review of Systems  Constitutional: Negative for activity change, appetite change, chills and fever.  HENT: Negative for congestion, nosebleeds, trouble swallowing and voice change.   Respiratory: Negative for cough, shortness of breath and wheezing.   Gastrointestinal: Negative for diarrhea, nausea and vomiting.  Genitourinary: Negative for difficulty urinating, dysuria, flank pain and hematuria.  Musculoskeletal: Negative for back pain, joint swelling and neck pain.  Neurological: Negative for dizziness, speech difficulty, light-headedness and numbness.  See HPI. All other review of systems negative.     Objective:    Physical Exam  BP 123/70   Pulse (!) 113   Temp 98.4 F (36.9 C) (Oral)   Ht 5\' 4"  (1.626 m)   Wt 167 lb 6.4 oz (75.9 kg)   LMP 01/23/2019   SpO2 96%   BMI 28.73 kg/m  Wt Readings from Last 3 Encounters:  02/08/19 167 lb 6.4 oz (75.9 kg)  04/24/18 164 lb (74.4 kg)  05/14/17 161 lb 6.4 oz (73.2 kg) (88 %, Z= 1.18)*   * Growth percentiles are based on CDC (Girls, 2-20 Years) data.   Physical Exam  Constitutional: Oriented to person, place, and time. Appears well-developed and well-nourished.  HENT:   Head: Normocephalic and atraumatic.  Eyes: Conjunctivae and EOM are normal.  Cardiovascular: Normal rate, regular rhythm, normal heart sounds and intact distal pulses.  No murmur heard. Pulmonary/Chest: Effort normal and breath sounds normal. No stridor. No respiratory distress. Has no wheezes.  Neurological: Is alert and oriented to person, place, and time.  Skin: Skin is warm. Capillary refill takes less than 2 seconds.  Psychiatric: Has a normal mood and affect. Behavior is normal. Judgment and thought content normal.   ECG - NSR, HR 89  Health Maintenance Due  Topic Date Due  . PAP-Cervical Cytology Screening  10/14/2018  . PAP SMEAR-Modifier  10/14/2018  . INFLUENZA VACCINE  11/07/2018    There are no preventive care reminders to display for this patient.  No results found for: TSH Lab Results  Component Value Date   WBC 15.1 (H) 09/29/2016   HGB 13.5 09/29/2016   HCT 40.0 09/29/2016   MCV 88.5 09/29/2016   PLT 282 09/29/2016   Lab Results  Component Value Date   NA 138 09/29/2016   K 3.7 09/29/2016   CO2 21 (L) 09/29/2016   GLUCOSE 99 09/29/2016   BUN 12 09/29/2016   CREATININE 0.62 09/29/2016   CALCIUM 9.8 09/29/2016   ANIONGAP 9 09/29/2016      Assessment & Plan:   Problem List Items Addressed This Visit      Other   Attention deficit disorder - Primary   Relevant Medications   methylphenidate (CONCERTA) 36 MG PO CR tablet (Start on 02/16/2019)   Other Relevant Orders   EKG 12-Lead    Other Visit Diagnoses    Encounter for medication monitoring       Relevant Orders   EKG 12-Lead   Tachycardia       Relevant Orders   EKG 12-Lead     Reviewed ECG which was done for routine monitoring for ADHD Allowed for an early prescription to be filled due to adjustment for travel Pt tachycardia is likely normal but discussed that stimulants can cause this PMP aware reviewed Pt stable on current dose with mood swings, anorexia or unintentional weight loss  No abberrant use noted  Reviewed common adverse reactions     Meds ordered this encounter  Medications  . methylphenidate (CONCERTA) 36 MG PO CR tablet    Sig: Take 2 tablets (72 mg total) by mouth daily.    Dispense:  60 tablet    Refill:  0    Please allow early fill because patient is traveling.    Follow-up: No follow-ups on  file.    Forrest Moron, MD

## 2019-02-26 ENCOUNTER — Encounter: Payer: Self-pay | Admitting: Family Medicine

## 2019-03-10 ENCOUNTER — Other Ambulatory Visit: Payer: Self-pay | Admitting: Family Medicine

## 2019-03-10 DIAGNOSIS — F988 Other specified behavioral and emotional disorders with onset usually occurring in childhood and adolescence: Secondary | ICD-10-CM

## 2019-03-10 NOTE — Telephone Encounter (Signed)
Pt and her mother walked into the office to inform Nolon Rod that pt lost her prescription and can not find it. They are requesting a new prescription knowing that insurance may not cover it  Requesting prozac to last 45 days. Pt leaves next Friday and will be gone until 04/16/19 and would like prescription to be filled before then Has psychiatric appt on 04/30/19

## 2019-03-11 MED ORDER — METHYLPHENIDATE HCL ER (OSM) 36 MG PO TBCR: 72.0000 mg | EXTENDED_RELEASE_TABLET | Freq: Every day | ORAL | 0 refills | Status: DC

## 2019-03-12 ENCOUNTER — Telehealth: Payer: Self-pay

## 2019-03-12 ENCOUNTER — Other Ambulatory Visit: Payer: Self-pay

## 2019-03-12 DIAGNOSIS — F4323 Adjustment disorder with mixed anxiety and depressed mood: Secondary | ICD-10-CM

## 2019-03-12 MED ORDER — FLUOXETINE HCL 20 MG PO CAPS: ORAL_CAPSULE | ORAL | 3 refills | Status: DC

## 2019-03-12 NOTE — Telephone Encounter (Signed)
Pt came to office with mother.  C/b to speak to them.  Pt states she usually takes meds every day but sometimes misses doses.  Last refill was 10/16 but is not able to find the bottle.  Is leaving next week to go out of country and will return 01/08.  Has psych appt two weeks later.  Advised will refill one #90 prescription but further refills need approval. Pt agreeable.

## 2019-04-12 ENCOUNTER — Encounter: Payer: Self-pay | Admitting: Family Medicine

## 2019-04-19 ENCOUNTER — Other Ambulatory Visit: Payer: Self-pay | Admitting: Family Medicine

## 2019-04-19 DIAGNOSIS — F988 Other specified behavioral and emotional disorders with onset usually occurring in childhood and adolescence: Secondary | ICD-10-CM

## 2019-04-19 NOTE — Telephone Encounter (Signed)
Please advise 

## 2019-04-19 NOTE — Telephone Encounter (Signed)
Pt needs 2 month supple of her methylphenidate (CONCERTA) 36 MG PO CR tablet [[id number]]  Because she is in the [place] and [address] for school please call meds in to pharmacy we have on file  is someone that can take it to her on Sunday   FR

## 2019-04-23 MED ORDER — METHYLPHENIDATE HCL ER (OSM) 36 MG PO TBCR: 72.0000 mg | EXTENDED_RELEASE_TABLET | Freq: Every day | ORAL | 0 refills | Status: DC

## 2019-04-23 NOTE — Telephone Encounter (Signed)
Pt's mother in to office re refill request.  Messaged provider who states it will handled electronically this afternoon.  Mother advised.

## 2019-04-23 NOTE — Addendum Note (Signed)
Addended by: Collie Siad A on: 04/23/2019 03:41 PM   Modules accepted: Orders

## 2019-05-10 ENCOUNTER — Other Ambulatory Visit: Payer: Self-pay | Admitting: Family Medicine

## 2019-05-10 DIAGNOSIS — F988 Other specified behavioral and emotional disorders with onset usually occurring in childhood and adolescence: Secondary | ICD-10-CM

## 2019-05-10 NOTE — Telephone Encounter (Signed)
Please advise patient would like a early refill due to travel

## 2019-05-12 MED ORDER — METHYLPHENIDATE HCL ER (OSM) 36 MG PO TBCR: 72.0000 mg | EXTENDED_RELEASE_TABLET | Freq: Every day | ORAL | 0 refills | Status: DC

## 2019-06-07 ENCOUNTER — Other Ambulatory Visit: Payer: Self-pay | Admitting: Family Medicine

## 2019-06-07 DIAGNOSIS — F988 Other specified behavioral and emotional disorders with onset usually occurring in childhood and adolescence: Secondary | ICD-10-CM

## 2019-06-07 NOTE — Telephone Encounter (Signed)
Patient is requesting a refill of the following medications: Requested Prescriptions   Pending Prescriptions Disp Refills  . methylphenidate (CONCERTA) 36 MG PO CR tablet 60 tablet 0    Sig: Take 2 tablets (72 mg total) by mouth daily.    Date of patient request:06/07/19 Last office visit: 02/08/2019 Date of last refill:05/21/2019 Last refill amount: 60 tablets Follow up time period per chart: Appointment scheduled

## 2019-06-08 MED ORDER — METHYLPHENIDATE HCL ER (OSM) 36 MG PO TBCR: 72.0000 mg | EXTENDED_RELEASE_TABLET | Freq: Every day | ORAL | 0 refills | Status: DC

## 2019-06-08 NOTE — Telephone Encounter (Signed)
Last fill date 05/21/19

## 2019-06-17 ENCOUNTER — Telehealth: Payer: Self-pay | Admitting: Family Medicine

## 2019-06-17 ENCOUNTER — Encounter: Payer: Self-pay | Admitting: *Deleted

## 2019-06-17 ENCOUNTER — Other Ambulatory Visit: Payer: Self-pay | Admitting: Family Medicine

## 2019-06-17 DIAGNOSIS — F988 Other specified behavioral and emotional disorders with onset usually occurring in childhood and adolescence: Secondary | ICD-10-CM

## 2019-06-17 MED ORDER — METHYLPHENIDATE HCL ER (OSM) 36 MG PO TBCR: 72.0000 mg | EXTENDED_RELEASE_TABLET | Freq: Every day | ORAL | 0 refills | Status: DC

## 2019-06-17 NOTE — Telephone Encounter (Signed)
[facility]  Calling needing early refill by 1 day sent over  methylphenidate (CONCERTA) 36 MG PO  Clinical is working on this to happen

## 2019-06-17 NOTE — Telephone Encounter (Signed)
Please Advise

## 2019-06-17 NOTE — Telephone Encounter (Signed)
Dr. Leretha Pol filled in absence of Dr. Creta Levin.

## 2019-06-17 NOTE — Telephone Encounter (Signed)
Please advise 

## 2019-06-17 NOTE — Telephone Encounter (Signed)
What is the name of the medication? methylphenidate (CONCERTA) 36 MG PO CR tablet    Have you contacted your pharmacy to request a refill? Y  Which pharmacy would you like this sent to?[id number] [place], [place] - [address] AT [facility] OF [address] [place] [address]  Phone:  [phone] Fax:  [phone]  DEA #:  [id number]   Patient notified that their request is being sent to the clinical staff for review and that they should receive a call once it is complete. If they do not receive a call within 72 hours they can check with their pharmacy or our office.   Pt is leaving tomorrow needs to be released today /  Pharmacy is awaiting ok from  provider  Dennie Bible Mother states that she called yesterday with no response

## 2019-06-17 NOTE — Progress Notes (Signed)
Patient of Dr Creta Levin who is off today Last OV Nov 2020 Due for refill of concerta pmp reviewed Med refill for 30 days, future refills from PCP

## 2019-06-18 ENCOUNTER — Telehealth: Payer: Self-pay

## 2019-06-18 MED ORDER — METHYLPHENIDATE HCL ER (OSM) 36 MG PO TBCR: 72.0000 mg | EXTENDED_RELEASE_TABLET | Freq: Every day | ORAL | 0 refills | Status: DC

## 2019-06-18 NOTE — Telephone Encounter (Signed)
Refill sent.

## 2019-06-18 NOTE — Addendum Note (Signed)
Addended by: Collie Siad A on: 06/18/2019 03:24 PM   Modules accepted: Orders

## 2019-07-15 ENCOUNTER — Other Ambulatory Visit: Payer: Self-pay | Admitting: Family Medicine

## 2019-07-15 DIAGNOSIS — F988 Other specified behavioral and emotional disorders with onset usually occurring in childhood and adolescence: Secondary | ICD-10-CM

## 2019-07-15 NOTE — Telephone Encounter (Signed)
Patient is requesting a refill of the following medications: Requested Prescriptions   Pending Prescriptions Disp Refills   methylphenidate (CONCERTA) 36 MG PO CR tablet 60 tablet 0    Sig: Take 2 tablets (72 mg total) by mouth daily.    Date of patient request: 07/15/2019 Last office visit: 02/26/2019 Date of last refill: 06/18/2019 Last refill amount: 60 tab

## 2019-07-16 MED ORDER — METHYLPHENIDATE HCL ER (OSM) 36 MG PO TBCR: 72.0000 mg | EXTENDED_RELEASE_TABLET | Freq: Every day | ORAL | 0 refills | Status: DC

## 2019-08-09 ENCOUNTER — Ambulatory Visit: Payer: 59 | Admitting: Family Medicine

## 2019-08-16 NOTE — Telephone Encounter (Signed)
No action needed, closing encounter

## 2019-08-18 ENCOUNTER — Other Ambulatory Visit: Payer: Self-pay | Admitting: Family Medicine

## 2019-08-18 DIAGNOSIS — F988 Other specified behavioral and emotional disorders with onset usually occurring in childhood and adolescence: Secondary | ICD-10-CM

## 2019-08-18 NOTE — Telephone Encounter (Signed)
Patient is requesting a refill of the following medications: Requested Prescriptions   Pending Prescriptions Disp Refills   methylphenidate (CONCERTA) 36 MG PO CR tablet 60 tablet 0    Sig: Take 2 tablets (72 mg total) by mouth daily.    Date of patient request: 08/18/19 Last office visit: 02/08/20 Date of last refill: 07/16/19 Last refill amount:60 Follow up time period per chart: 08/26/19

## 2019-08-20 MED ORDER — METHYLPHENIDATE HCL ER (OSM) 36 MG PO TBCR: 72.0000 mg | EXTENDED_RELEASE_TABLET | Freq: Every day | ORAL | 0 refills | Status: DC

## 2019-08-26 ENCOUNTER — Encounter: Payer: 59 | Admitting: Adult Health Nurse Practitioner

## 2019-09-21 ENCOUNTER — Telehealth (INDEPENDENT_AMBULATORY_CARE_PROVIDER_SITE_OTHER): Payer: 59 | Admitting: Family Medicine

## 2019-09-21 ENCOUNTER — Ambulatory Visit: Payer: 59 | Admitting: Registered Nurse

## 2019-09-21 ENCOUNTER — Encounter: Payer: Self-pay | Admitting: Family Medicine

## 2019-09-21 DIAGNOSIS — F909 Attention-deficit hyperactivity disorder, unspecified type: Secondary | ICD-10-CM

## 2019-09-21 MED ORDER — CONCERTA 36 MG PO TBCR: 72.0000 mg | EXTENDED_RELEASE_TABLET | Freq: Every day | ORAL | 0 refills | Status: DC

## 2019-09-21 NOTE — Progress Notes (Signed)
Pt needs refill Concerta  Or generic, pt states has low appetite through the day that returns to normal around dinner states this doesn't bother her and she does not get any other side effects.

## 2019-09-21 NOTE — Patient Instructions (Signed)
° ° ° °  If you have lab work done today you will be contacted with your lab results within the next 2 weeks.  If you have not heard from us then please contact us. The fastest way to get your results is to register for My Chart. ° ° °IF you received an x-ray today, you will receive an invoice from [facility]. Please contact [facility] at [phone] with questions or concerns regarding your invoice.  ° °IF you received labwork today, you will receive an invoice from [facility]. Please contact [facility] at [phone] with questions or concerns regarding your invoice.  ° °Our billing staff will not be able to assist you with questions regarding bills from these companies. ° °You will be contacted with the lab results as soon as they are available. The fastest way to get your results is to activate your My Chart account. Instructions are located on the last page of this paperwork. If you have not heard from us regarding the results in 2 weeks, please contact this office. °  ° ° ° °

## 2019-09-21 NOTE — Progress Notes (Signed)
   Virtual Visit Note  I connected with patient on 09/21/19 at 605pm by video doxymity and verified that I am speaking with the correct person using two identifiers. Anne Matthews is currently located at home and patient is currently with them during visit. The provider, Myles Lipps, MD is located in their office at time of visit.  I discussed the limitations, risks, security and privacy concerns of performing an evaluation and management service by telephone and the availability of in person appointments. I also discussed with the patient that there may be a patient responsible charge related to this service. The patient expressed understanding and agreed to proceed.   I provided 10 minutes of non-face-to-face time during this encounter.  CC: ADHD  HPI ? 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 Authorizing Provider  FLUoxetine (PROZAC) 20 MG capsule TAKE 1 CAPSULE(20 MG) BY MOUTH DAILY 03/12/19  Yes Stallings, Zoe A, MD  fluticasone (FLONASE) 50 MCG/ACT nasal spray Place 1 spray into both nostrils daily as needed for allergies or rhinitis.   Yes [provider]  methylphenidate (CONCERTA) 36 MG PO CR tablet Take 2 tablets (72 mg total) by mouth daily. 08/20/19  Yes 08/22/19, MD    Past Medical History:  Diagnosis Date  . ADHD (attention deficit  hyperactivity disorder)     No past surgical history on file.  Social History   Tobacco Use  . Smoking status: Never Smoker  . Smokeless tobacco: Never Used  Substance Use Topics  . Alcohol use: No    No family history on file.  ROS Per hpi   Objective  Vitals as reported by the patient: none  GEN: AAOx3, NAD HEENT: [place]/AT, pupils are symmetrical, EOMI, non-icteric sclera Resp: breathing comfortably, speaking in full sentences Skin: no rashes noted, no pallor Psych: good eye contact, normal mood and affect   ASSESSMENT and PLAN  1. Attention deficit hyperactivity disorder (ADHD), unspecified ADHD type Stable on current regime. pmp reviewed. Call in 3 months for refills, next OV in 6 months.  Other orders - CONCERTA 36 MG CR tablet; Take 2 tablets (72 mg total) by mouth daily before breakfast. - CONCERTA 36 MG CR tablet; Take 2 tablets (72 mg total) by mouth daily before breakfast. - CONCERTA 36 MG CR tablet; Take 2 tablets (72 mg total) by mouth daily before breakfast.  FOLLOW-UP: per above   The above assessment and management plan was discussed with the patient. The patient verbalized understanding of and has agreed to the management plan. Patient is aware to call the clinic if symptoms persist or worsen. Patient is aware when to return to the clinic for a follow-up visit. Patient educated on when it is appropriate to go to the emergency department.     Doristine Bosworth, MD [facility] at [facility] [address] [place], [place] Ph.  [phone] Fax [phone]

## 2019-09-23 ENCOUNTER — Encounter: Payer: 59 | Admitting: Family Medicine

## 2019-10-14 ENCOUNTER — Encounter: Payer: Self-pay | Admitting: Family Medicine

## 2019-10-14 DIAGNOSIS — F411 Generalized anxiety disorder: Secondary | ICD-10-CM

## 2019-10-14 DIAGNOSIS — F909 Attention-deficit hyperactivity disorder, unspecified type: Secondary | ICD-10-CM

## 2019-10-29 ENCOUNTER — Other Ambulatory Visit: Payer: Self-pay | Admitting: Family Medicine

## 2019-10-29 MED ORDER — CONCERTA 36 MG PO TBCR: 72.0000 mg | EXTENDED_RELEASE_TABLET | Freq: Every day | ORAL | 0 refills | Status: DC

## 2019-10-29 NOTE — Telephone Encounter (Signed)
pmp reviewd, appropriate meds refilled 

## 2019-12-20 ENCOUNTER — Other Ambulatory Visit: Payer: Self-pay

## 2019-12-20 ENCOUNTER — Encounter: Payer: 59 | Admitting: Family Medicine

## 2019-12-20 ENCOUNTER — Ambulatory Visit (INDEPENDENT_AMBULATORY_CARE_PROVIDER_SITE_OTHER): Payer: 59 | Admitting: Family Medicine

## 2019-12-20 VITALS — BP 123/83 | HR 112 | Temp 98.7°F | Ht 64.0 in | Wt 159.0 lb

## 2019-12-20 DIAGNOSIS — Z23 Encounter for immunization: Secondary | ICD-10-CM | POA: Diagnosis not present

## 2019-12-20 NOTE — Patient Instructions (Signed)
° ° ° °  If you have lab work done today you will be contacted with your lab results within the next 2 weeks.  If you have not heard from us then please contact us. The fastest way to get your results is to register for My Chart. ° ° °IF you received an x-ray today, you will receive an invoice from [facility]. Please contact [facility] at [phone] with questions or concerns regarding your invoice.  ° °IF you received labwork today, you will receive an invoice from [facility]. Please contact [facility] at [phone] with questions or concerns regarding your invoice.  ° °Our billing staff will not be able to assist you with questions regarding bills from these companies. ° °You will be contacted with the lab results as soon as they are available. The fastest way to get your results is to activate your My Chart account. Instructions are located on the last page of this paperwork. If you have not heard from us regarding the results in 2 weeks, please contact this office. °  ° ° ° °

## 2019-12-20 NOTE — Patient Instructions (Signed)
° ° ° °  If you have lab work done today you will be contacted with your lab results within the next 2 weeks.  If you have not heard from us then please contact us. The fastest way to get your results is to register for My Chart. ° ° °IF you received an x-ray today, you will receive an invoice from [facility]. Please contact [facility] at [phone] with questions or concerns regarding your invoice.  ° °IF you received labwork today, you will receive an invoice from [facility]. Please contact [facility] at [phone] with questions or concerns regarding your invoice.  ° °Our billing staff will not be able to assist you with questions regarding bills from these companies. ° °You will be contacted with the lab results as soon as they are available. The fastest way to get your results is to activate your My Chart account. Instructions are located on the last page of this paperwork. If you have not heard from us regarding the results in 2 weeks, please contact this office. °  ° ° ° °

## 2020-01-06 NOTE — Progress Notes (Signed)
This encounter was created in error - please disregard.

## 2020-02-11 DIAGNOSIS — F411 Generalized anxiety disorder: Secondary | ICD-10-CM | POA: Diagnosis not present

## 2020-02-11 DIAGNOSIS — F321 Major depressive disorder, single episode, moderate: Secondary | ICD-10-CM | POA: Diagnosis not present

## 2020-02-15 DIAGNOSIS — F900 Attention-deficit hyperactivity disorder, predominantly inattentive type: Secondary | ICD-10-CM | POA: Diagnosis not present

## 2020-02-15 DIAGNOSIS — F324 Major depressive disorder, single episode, in partial remission: Secondary | ICD-10-CM | POA: Diagnosis not present

## 2020-02-25 DIAGNOSIS — F321 Major depressive disorder, single episode, moderate: Secondary | ICD-10-CM | POA: Diagnosis not present

## 2020-02-25 DIAGNOSIS — F411 Generalized anxiety disorder: Secondary | ICD-10-CM | POA: Diagnosis not present

## 2020-03-10 DIAGNOSIS — F321 Major depressive disorder, single episode, moderate: Secondary | ICD-10-CM | POA: Diagnosis not present

## 2020-03-10 DIAGNOSIS — F411 Generalized anxiety disorder: Secondary | ICD-10-CM | POA: Diagnosis not present

## 2020-03-27 DIAGNOSIS — Z111 Encounter for screening for respiratory tuberculosis: Secondary | ICD-10-CM | POA: Diagnosis not present

## 2020-03-29 DIAGNOSIS — Z111 Encounter for screening for respiratory tuberculosis: Secondary | ICD-10-CM | POA: Diagnosis not present

## 2020-08-15 DIAGNOSIS — F324 Major depressive disorder, single episode, in partial remission: Secondary | ICD-10-CM | POA: Diagnosis not present

## 2020-08-15 DIAGNOSIS — F900 Attention-deficit hyperactivity disorder, predominantly inattentive type: Secondary | ICD-10-CM | POA: Diagnosis not present

## 2020-10-02 DIAGNOSIS — M5459 Other low back pain: Secondary | ICD-10-CM | POA: Diagnosis not present

## 2020-10-02 DIAGNOSIS — M545 Low back pain, unspecified: Secondary | ICD-10-CM | POA: Diagnosis not present

## 2020-10-04 DIAGNOSIS — M5416 Radiculopathy, lumbar region: Secondary | ICD-10-CM | POA: Diagnosis not present

## 2020-10-13 DIAGNOSIS — M545 Low back pain, unspecified: Secondary | ICD-10-CM | POA: Diagnosis not present

## 2020-10-28 DIAGNOSIS — F411 Generalized anxiety disorder: Secondary | ICD-10-CM | POA: Diagnosis not present

## 2020-10-28 DIAGNOSIS — F321 Major depressive disorder, single episode, moderate: Secondary | ICD-10-CM | POA: Diagnosis not present

## 2020-10-28 DIAGNOSIS — F4001 Agoraphobia with panic disorder: Secondary | ICD-10-CM | POA: Diagnosis not present

## 2020-11-14 DIAGNOSIS — F411 Generalized anxiety disorder: Secondary | ICD-10-CM | POA: Diagnosis not present

## 2020-11-14 DIAGNOSIS — F321 Major depressive disorder, single episode, moderate: Secondary | ICD-10-CM | POA: Diagnosis not present

## 2020-11-14 DIAGNOSIS — F4001 Agoraphobia with panic disorder: Secondary | ICD-10-CM | POA: Diagnosis not present

## 2020-11-22 DIAGNOSIS — F411 Generalized anxiety disorder: Secondary | ICD-10-CM | POA: Diagnosis not present

## 2020-11-22 DIAGNOSIS — F321 Major depressive disorder, single episode, moderate: Secondary | ICD-10-CM | POA: Diagnosis not present

## 2020-11-24 DIAGNOSIS — F4001 Agoraphobia with panic disorder: Secondary | ICD-10-CM | POA: Diagnosis not present

## 2020-11-24 DIAGNOSIS — F321 Major depressive disorder, single episode, moderate: Secondary | ICD-10-CM | POA: Diagnosis not present

## 2020-11-24 DIAGNOSIS — F411 Generalized anxiety disorder: Secondary | ICD-10-CM | POA: Diagnosis not present

## 2020-11-24 DIAGNOSIS — F900 Attention-deficit hyperactivity disorder, predominantly inattentive type: Secondary | ICD-10-CM | POA: Diagnosis not present

## 2020-11-27 DIAGNOSIS — F411 Generalized anxiety disorder: Secondary | ICD-10-CM | POA: Diagnosis not present

## 2020-11-27 DIAGNOSIS — F321 Major depressive disorder, single episode, moderate: Secondary | ICD-10-CM | POA: Diagnosis not present

## 2020-11-27 DIAGNOSIS — F4001 Agoraphobia with panic disorder: Secondary | ICD-10-CM | POA: Diagnosis not present

## 2020-12-04 DIAGNOSIS — F411 Generalized anxiety disorder: Secondary | ICD-10-CM | POA: Diagnosis not present

## 2020-12-04 DIAGNOSIS — F321 Major depressive disorder, single episode, moderate: Secondary | ICD-10-CM | POA: Diagnosis not present

## 2021-01-04 ENCOUNTER — Ambulatory Visit: Payer: BC Managed Care – PPO | Admitting: Podiatry

## 2021-01-06 DIAGNOSIS — F4001 Agoraphobia with panic disorder: Secondary | ICD-10-CM | POA: Diagnosis not present

## 2021-01-06 DIAGNOSIS — F900 Attention-deficit hyperactivity disorder, predominantly inattentive type: Secondary | ICD-10-CM | POA: Diagnosis not present

## 2021-01-06 DIAGNOSIS — F321 Major depressive disorder, single episode, moderate: Secondary | ICD-10-CM | POA: Diagnosis not present

## 2021-01-08 DIAGNOSIS — F4001 Agoraphobia with panic disorder: Secondary | ICD-10-CM | POA: Diagnosis not present

## 2021-01-08 DIAGNOSIS — F321 Major depressive disorder, single episode, moderate: Secondary | ICD-10-CM | POA: Diagnosis not present

## 2021-01-08 DIAGNOSIS — F411 Generalized anxiety disorder: Secondary | ICD-10-CM | POA: Diagnosis not present

## 2021-01-09 ENCOUNTER — Ambulatory Visit: Payer: BC Managed Care – PPO | Admitting: Podiatry

## 2021-01-09 ENCOUNTER — Other Ambulatory Visit: Payer: Self-pay

## 2021-01-09 ENCOUNTER — Encounter: Payer: Self-pay | Admitting: Podiatry

## 2021-01-09 DIAGNOSIS — L600 Ingrowing nail: Secondary | ICD-10-CM | POA: Diagnosis not present

## 2021-01-09 MED ORDER — NEOMYCIN-POLYMYXIN-HC 1 % OT SOLN: OTIC | 0 refills | Status: DC

## 2021-01-09 NOTE — Patient Instructions (Signed)

## 2021-01-09 NOTE — Progress Notes (Signed)
  Subjective:  Patient ID: Anne Matthews, female    DOB: 1995-05-15,  MRN: [id number]  Chief Complaint  Patient presents with   Toe Pain     NP //Right foot Great toe     25 y.o. female presents with the above complaint. History confirmed with patient.  The worst is the inside of the right great toe but she has chronic ingrown toenails on both sides of both toes  Objective:  Physical Exam: warm, good capillary refill, no trophic changes or ulcerative lesions, normal DP and PT pulses, and normal sensory exam.  Chronic ingrowing toenails of the bilateral hallux and medial and lateral borders without major paronychia Assessment:   1. Ingrowing left great toenail   2. Ingrowing right great toenail      Plan:  Patient was evaluated and treated and all questions answered.    Ingrown Nail, bilaterally -Patient elects to proceed with minor surgery to remove ingrown toenail today. Consent reviewed and signed by patient. -Ingrown nail excised. See procedure note. -Educated on post-procedure care including soaking. Written instructions provided and reviewed. -Patient to follow up in 2 weeks for nail check.  Procedure: Excision of Ingrown Toenail Location: Bilateral 1st toe  medial and lateral  nail borders. Anesthesia: Lidocaine 1% plain; 1.5 mL and Marcaine 0.5% plain; 1.5 mL, digital block. Skin Prep: Betadine. Dressing: Silvadene; telfa; dry, sterile, compression dressing. Technique: Following skin prep, the toe was exsanguinated and a tourniquet was secured at the base of the toe. The affected nail border was freed, split with a nail splitter, and excised. Chemical matrixectomy was then performed with phenol and irrigated out with alcohol. The tourniquet was then removed and sterile dressing applied. Disposition: Patient tolerated procedure well. Patient to return in 2 weeks for follow-up.    Return in about 2 weeks (around 01/23/2021) for nail re-check.

## 2021-01-23 ENCOUNTER — Other Ambulatory Visit: Payer: Self-pay

## 2021-01-23 ENCOUNTER — Ambulatory Visit: Payer: BC Managed Care – PPO | Admitting: Podiatry

## 2021-01-23 DIAGNOSIS — F411 Generalized anxiety disorder: Secondary | ICD-10-CM | POA: Diagnosis not present

## 2021-01-23 DIAGNOSIS — L600 Ingrowing nail: Secondary | ICD-10-CM

## 2021-01-23 DIAGNOSIS — F321 Major depressive disorder, single episode, moderate: Secondary | ICD-10-CM | POA: Diagnosis not present

## 2021-01-23 DIAGNOSIS — F4001 Agoraphobia with panic disorder: Secondary | ICD-10-CM | POA: Diagnosis not present

## 2021-01-23 MED ORDER — CEPHALEXIN 500 MG PO CAPS: 500.0000 mg | ORAL_CAPSULE | Freq: Three times a day (TID) | ORAL | 0 refills | Status: DC

## 2021-01-24 NOTE — Progress Notes (Signed)
  Subjective:  Patient ID: Anne Matthews, female    DOB: 10-Dec-1995,  MRN: [id number]  Chief Complaint  Patient presents with   Ingrown Toenail    Nail check left    25 y.o. female presents with the above complaint. History confirmed with patient.  Doing well seems to be healing okay, she dropped something on the right foot  Objective:  Physical Exam: warm, good capillary refill, no trophic changes or ulcerative lesions, normal DP and PT pulses, and normal sensory exam.  Matricectomy sites healing well with the right hallux has some erythema and more so than the left Assessment:   1. Ingrowing left great toenail   2. Ingrowing right great toenail      Plan:  Patient was evaluated and treated and all questions answered.    Ingrown Nail, bilaterally -Can leave open to air and discontinue soaks and ointment at this point.  She does have some erythema on the right side more than I am used to seeing with the phenol.  I recommend 5 days of Keflex and this should resolve uneventfully.  Return to see me as needed if it does not  Return if symptoms worsen or fail to improve.

## 2021-02-07 DIAGNOSIS — F900 Attention-deficit hyperactivity disorder, predominantly inattentive type: Secondary | ICD-10-CM | POA: Diagnosis not present

## 2021-02-07 DIAGNOSIS — F321 Major depressive disorder, single episode, moderate: Secondary | ICD-10-CM | POA: Diagnosis not present

## 2021-02-07 DIAGNOSIS — F4312 Post-traumatic stress disorder, chronic: Secondary | ICD-10-CM | POA: Diagnosis not present

## 2021-02-07 DIAGNOSIS — F4001 Agoraphobia with panic disorder: Secondary | ICD-10-CM | POA: Diagnosis not present

## 2021-02-12 DIAGNOSIS — F321 Major depressive disorder, single episode, moderate: Secondary | ICD-10-CM | POA: Diagnosis not present

## 2021-02-12 DIAGNOSIS — F4312 Post-traumatic stress disorder, chronic: Secondary | ICD-10-CM | POA: Diagnosis not present

## 2021-02-12 DIAGNOSIS — F4001 Agoraphobia with panic disorder: Secondary | ICD-10-CM | POA: Diagnosis not present

## 2021-02-19 DIAGNOSIS — F321 Major depressive disorder, single episode, moderate: Secondary | ICD-10-CM | POA: Diagnosis not present

## 2021-02-19 DIAGNOSIS — F411 Generalized anxiety disorder: Secondary | ICD-10-CM | POA: Diagnosis not present

## 2021-02-19 DIAGNOSIS — F4312 Post-traumatic stress disorder, chronic: Secondary | ICD-10-CM | POA: Diagnosis not present

## 2021-02-19 DIAGNOSIS — F4001 Agoraphobia with panic disorder: Secondary | ICD-10-CM | POA: Diagnosis not present

## 2021-02-26 DIAGNOSIS — F4312 Post-traumatic stress disorder, chronic: Secondary | ICD-10-CM | POA: Diagnosis not present

## 2021-02-26 DIAGNOSIS — F4001 Agoraphobia with panic disorder: Secondary | ICD-10-CM | POA: Diagnosis not present

## 2021-02-26 DIAGNOSIS — F321 Major depressive disorder, single episode, moderate: Secondary | ICD-10-CM | POA: Diagnosis not present

## 2021-03-27 DIAGNOSIS — F321 Major depressive disorder, single episode, moderate: Secondary | ICD-10-CM | POA: Diagnosis not present

## 2021-03-27 DIAGNOSIS — F4312 Post-traumatic stress disorder, chronic: Secondary | ICD-10-CM | POA: Diagnosis not present

## 2021-03-27 DIAGNOSIS — F4001 Agoraphobia with panic disorder: Secondary | ICD-10-CM | POA: Diagnosis not present

## 2021-05-04 DIAGNOSIS — F4001 Agoraphobia with panic disorder: Secondary | ICD-10-CM | POA: Diagnosis not present

## 2021-05-04 DIAGNOSIS — F900 Attention-deficit hyperactivity disorder, predominantly inattentive type: Secondary | ICD-10-CM | POA: Diagnosis not present

## 2021-05-04 DIAGNOSIS — F4312 Post-traumatic stress disorder, chronic: Secondary | ICD-10-CM | POA: Diagnosis not present

## 2021-05-24 DIAGNOSIS — L249 Irritant contact dermatitis, unspecified cause: Secondary | ICD-10-CM | POA: Diagnosis not present

## 2021-06-22 ENCOUNTER — Ambulatory Visit: Payer: Managed Care, Other (non HMO) | Admitting: Family Medicine

## 2021-07-19 DIAGNOSIS — Z79899 Other long term (current) drug therapy: Secondary | ICD-10-CM | POA: Diagnosis not present

## 2021-07-19 DIAGNOSIS — F902 Attention-deficit hyperactivity disorder, combined type: Secondary | ICD-10-CM | POA: Diagnosis not present

## 2021-07-19 DIAGNOSIS — F419 Anxiety disorder, unspecified: Secondary | ICD-10-CM | POA: Diagnosis not present

## 2021-07-25 DIAGNOSIS — R41840 Attention and concentration deficit: Secondary | ICD-10-CM | POA: Diagnosis not present

## 2021-08-20 DIAGNOSIS — F902 Attention-deficit hyperactivity disorder, combined type: Secondary | ICD-10-CM | POA: Diagnosis not present

## 2021-08-20 DIAGNOSIS — Z79899 Other long term (current) drug therapy: Secondary | ICD-10-CM | POA: Diagnosis not present

## 2021-08-20 DIAGNOSIS — F419 Anxiety disorder, unspecified: Secondary | ICD-10-CM | POA: Diagnosis not present

## 2021-08-21 ENCOUNTER — Encounter: Payer: Self-pay | Admitting: Family Medicine

## 2021-08-21 ENCOUNTER — Ambulatory Visit: Payer: BC Managed Care – PPO | Admitting: Family Medicine

## 2021-08-21 VITALS — BP 122/84 | HR 94 | Temp 98.1°F | Ht 64.0 in | Wt 189.1 lb

## 2021-08-21 DIAGNOSIS — H6983 Other specified disorders of Eustachian tube, bilateral: Secondary | ICD-10-CM | POA: Diagnosis not present

## 2021-08-21 DIAGNOSIS — F900 Attention-deficit hyperactivity disorder, predominantly inattentive type: Secondary | ICD-10-CM

## 2021-08-21 DIAGNOSIS — F4320 Adjustment disorder, unspecified: Secondary | ICD-10-CM | POA: Diagnosis not present

## 2021-08-21 MED ORDER — FLUTICASONE PROPIONATE 50 MCG/ACT NA SUSP: 1.0000 | Freq: Every day | NASAL | 3 refills | Status: AC

## 2021-08-21 NOTE — Patient Instructions (Signed)
Welcome to [facility]! It was a pleasure meeting you today.  As discussed, Please schedule a 6 month follow up visit today.  PLEASE NOTE:  If you had any LAB tests please let us know if you have not heard back within a few days. You may see your results on MyChart before we have a chance to review them but we will give you a call once they are reviewed by us. If we ordered any REFERRALS today, please let us know if you have not heard from their office within the next week.  Let us know through MyChart if you are needing REFILLS, or have your pharmacy send us the request. You can also use MyChart to communicate with me or any office staff.  Please try these tips to maintain a healthy lifestyle:  Eat most of your calories during the day when you are active. Eliminate processed foods including packaged sweets (pies, cakes, cookies), reduce intake of potatoes, white bread, white pasta, and white rice. Look for whole grain options, oat flour or almond flour.  Each meal should contain half fruits/vegetables, one quarter protein, and one quarter carbs (no bigger than a computer mouse).  Cut down on sweet beverages. This includes juice, soda, and sweet tea. Also watch fruit intake, though this is a healthier sweet option, it still contains natural sugar! Limit to 3 servings daily.  Drink at least 1 glass of water with each meal and aim for at least 8 glasses per day  Exercise at least 150 minutes every week.   

## 2021-08-21 NOTE — Progress Notes (Signed)
? ?New Patient Office Visit ? ?Subjective:  ?Patient ID: Anne Matthews, female    DOB: December 15, 1995  Age: 26 y.o. MRN: [id number] ? ?CC:  ?Chief Complaint  ?Patient presents with  ? Establish Care  ?  Need new PCP ?Fasting ?Will need to establish with gyn for pap, never done before  ? Ear Pain  ?  Right ear pain started on 08/10/2021 while trying to dive  ? ? ?HPI ?Anne Matthews presents for new pt.  Former PCP moved. ?Mom in and out of hospital-so pt taking care of her and others ? ?Anx/depression-xanax rare.  Working w/[facility] for ADHD/anx/dep-just came off Luvox and lamictal.  Adzenys-made psychotic. Some ST-but no plan and won't.  Just stressed.  ? ?Referral for R ear-a lot of issues in past.  Scuba dives but on 5/5-hard to clear R ear so aborted dive-this is second time had this.  05/14/19 as well.  And 08/13/19-sudafed helped that time. ?No URI this time. Has chronic tinnitis.no allergies now.  ? ?Never SA.  Has gotten first 2 gardisil.  No abn menses.  Reg.  08/11/21 LMP.  G0 ? ?Past Medical History:  ?Diagnosis Date  ? ADHD (attention deficit hyperactivity disorder)   ? Anxiety   ? [employer]rees[employer] 12/20/2019  ? Depression   ? [employer] 12/20/2019  ? ? ?Past Surgical History:  ?Procedure Laterality Date  ? TYMPANOSTOMY TUBE PLACEMENT    ? child  ? WISDOM TOOTH EXTRACTION    ? ? ?History reviewed. No pertinent family history. ? ?Social History  ? ?Socioeconomic History  ? Marital status: Single  ?  Spouse name: Not on file  ? Number of children: 0  ? Years of education: Not on file  ? Highest education level: Not on file  ?Occupational History  ? Not on file  ?Tobacco Use  ? Smoking status: Never  ? Smokeless tobacco: Never  ?Vaping Use  ? Vaping Use: Never used  ?Substance and Sexual Activity  ? Alcohol use: No  ?  Comment: <2/mo  ? Drug use: No  ? Sexual activity: Never  ?Other Topics Concern  ? Not on file  ?Social History Narrative  ? No partner  ? Finished undergrad.-BS   Accepted to grad  school, but caring for family so on hold  ?   ? Born to "2 moms"-sperm donar.  Lives in home of 8 others.    ? ?Social Determinants of Health  ? ?Financial Resource Strain: Not on file  ?Food Insecurity: Not on file  ?Transportation Needs: Not on file  ?Physical Activity: Not on file  ?Stress: Not on file  ?Social Connections: Not on file  ?Intimate Partner Violence: Not on file  ? ? ?ROS  ?ROS: ?Gen: no fever, chills  ?Skin: no rash, itching ?ENT: no ear drainage, nasal congestion, rhinorrhea, sinus pressure, sore throat ?Eyes: no blurry vision, double vision ?Resp: no cough, wheeze,SOB ?CV: no CP, palpitations, LE edema, occ racing if upset ?GI: no heartburn, n/v/d/c, abd pain ?GU: no dysuria, urgency, frequency, hematuria ?MSK: no joint pain, myalgias, back pain.  Some muscle soreness if stressful day.  Fell off horse 2018 and vert fx T8 and L1.  Fell June 2021-slipped down stairs at [facility]-MRI ok.   ?Neuro: no dizziness, headache, weakness, vertigo ?Psych: HPI ? ?Objective:  ? ?Today's Vitals: BP 122/84   Pulse 94   Temp 98.1 ?F (36.7 ?C) (Temporal)   Ht 5\' 4"  (1.626 m)   Wt 189 lb 2 oz (  85.8 kg)   LMP 08/11/2021 (Exact Date)   SpO2 99%   BMI 32.46 kg/m?  ? ?Physical Exam  ?Gen: WDWN NAD OWF ?HEENT: NCAT, conjunctiva not injected, sclera nonicteric ?TM WNL B, OP moist, no exudates  ?NECK:  supple, no thyromegaly, no nodes, no carotid bruits ?CARDIAC: RRR, S1S2+, no murmur. DP 2+B ?LUNGS: CTAB. No wheezes ?ABDOMEN:  BS+, soft, NTND, No HSM, no masses ?EXT:  no edema ?MSK: no gross abnormalities.  ?NEURO: A&O x3.  CN II-XII intact.  ?PSYCH: normal mood. Good eye contact  ?Scarring acne face ? ?Assessment & Plan:  ? ?Problem List Items Addressed This Visit   ? ?  ? Other  ? Attention deficit disorder  ? Adjustment reaction of adolescence  ? ?Other Visit Diagnoses   ? ? Dysfunction of both eustachian tubes    -  Primary  ? Relevant Orders  ? Ambulatory referral to ENT  ? ?  ? Ear pain R>L.  Hard to  equalize R ear.  ?ETD, other.  Will do flonase for 1 mo.  Refer ENT ?ADD/adjustment disorder-chronic.  Fair control.  A lot of stressors.  Working w/mental health-just got off meds-making adjustments.   ? ?Declines labs for now.  Will defer pap and 3rd Gardisil as pt never SA and a lot going on right now-so I'm not concerned about delay ? ?Outpatient Encounter Medications as of 08/21/2021  ?Medication Sig  ? ALPRAZolam (XANAX) 1 MG tablet Take 1 mg by mouth 2 (two) times daily as needed.  ? Dexmethylphenidate HCl 30 MG CP24 Take 1 capsule by mouth daily.  ? loratadine (CLARITIN) 10 MG tablet Take 10 mg by mouth daily.  ? [DISCONTINUED] fluticasone (FLONASE) 50 MCG/ACT nasal spray Place 1 spray into both nostrils daily as needed for allergies or rhinitis.  ? fluticasone (FLONASE) 50 MCG/ACT nasal spray Place 1 spray into both nostrils daily.  ? [DISCONTINUED] ADZENYS XR-ODT 18.8 MG TBED Take 1 tablet by mouth at bedtime.  ? [DISCONTINUED] cephALEXin (KEFLEX) 500 MG capsule Take 1 capsule (500 mg total) by mouth 3 (three) times daily.  ? [DISCONTINUED] citalopram (CELEXA) 20 MG tablet Take 20 mg by mouth daily.  ? [DISCONTINUED] CONCERTA 36 MG CR tablet Take 2 tablets (72 mg total) by mouth daily before breakfast.  ? [DISCONTINUED] CONCERTA 36 MG CR tablet Take 2 tablets (72 mg total) by mouth daily before breakfast.  ? [DISCONTINUED] lamoTRIgine (LAMICTAL) 200 MG tablet Take 200 mg by mouth daily.  ? [DISCONTINUED] methylphenidate (CONCERTA) 36 MG PO CR tablet Concerta 36 mg tablet,extended release  ? [DISCONTINUED] NEOMYCIN-POLYMYXIN-HYDROCORTISONE (CORTISPORIN) 1 % SOLN OTIC solution Apply to nail beds from procedure site twice daily after soaks  ? [DISCONTINUED] risperiDONE (RISPERDAL) 0.5 MG tablet Take 0.5 mg by mouth at bedtime.  ? [DISCONTINUED] sertraline (ZOLOFT) 100 MG tablet Take 100 mg by mouth daily.  ? ?No facility-administered encounter medications on file as of 08/21/2021.  ? ? ?Follow-up: Return in  about 6 months (around 02/21/2022) for annual.  ? ?Wellington Hampshire, MD ?

## 2021-08-22 ENCOUNTER — Telehealth: Payer: Self-pay | Admitting: *Deleted

## 2021-08-22 NOTE — Telephone Encounter (Signed)
Pharmacy sent over fax stating that plan does not cover Fluticasone 50 mcg  nasal spray. PA required or change medication. Please advise. ?

## 2021-08-23 NOTE — Telephone Encounter (Signed)
Patient notified and verbalized understanding. 

## 2021-08-31 ENCOUNTER — Encounter: Payer: Self-pay | Admitting: Family Medicine

## 2021-08-31 ENCOUNTER — Ambulatory Visit: Payer: BC Managed Care – PPO | Admitting: Family Medicine

## 2021-08-31 VITALS — BP 102/60 | HR 90 | Temp 98.6°F | Ht 64.0 in | Wt 190.5 lb

## 2021-08-31 DIAGNOSIS — M791 Myalgia, unspecified site: Secondary | ICD-10-CM

## 2021-08-31 LAB — CBC WITH DIFFERENTIAL/PLATELET
Basophils Absolute: 0.1 10*3/uL (ref 0.0–0.1)
Basophils Relative: 0.7 % (ref 0.0–3.0)
Eosinophils Absolute: 0.2 10*3/uL (ref 0.0–0.7)
Eosinophils Relative: 2.5 % (ref 0.0–5.0)
HCT: 40.8 % (ref 36.0–46.0)
Hemoglobin: 13.8 g/dL (ref 12.0–15.0)
Lymphocytes Relative: 23.2 % (ref 12.0–46.0)
Lymphs Abs: 2.2 10*3/uL (ref 0.7–4.0)
MCHC: 33.7 g/dL (ref 30.0–36.0)
MCV: 87.4 fl (ref 78.0–100.0)
Monocytes Absolute: 0.5 10*3/uL (ref 0.1–1.0)
Monocytes Relative: 5.5 % (ref 3.0–12.0)
Neutro Abs: 6.3 10*3/uL (ref 1.4–7.7)
Neutrophils Relative %: 68.1 % (ref 43.0–77.0)
Platelets: 342 10*3/uL (ref 150.0–400.0)
RBC: 4.67 Mil/uL (ref 3.87–5.11)
RDW: 13.6 % (ref 11.5–15.5)
WBC: 9.3 10*3/uL (ref 4.0–10.5)

## 2021-08-31 LAB — COMPREHENSIVE METABOLIC PANEL
ALT: 19 U/L (ref 0–35)
AST: 16 U/L (ref 0–37)
Albumin: 4.6 g/dL (ref 3.5–5.2)
Alkaline Phosphatase: 44 U/L (ref 39–117)
BUN: 16 mg/dL (ref 6–23)
CO2: 28 mEq/L (ref 19–32)
Calcium: 9.7 mg/dL (ref 8.4–10.5)
Chloride: 101 mEq/L (ref 96–112)
Creatinine, Ser: 0.59 mg/dL (ref 0.40–1.20)
GFR: 126.62 mL/min (ref >60.00)
Glucose, Bld: 93 mg/dL (ref 70–99)
Potassium: 4.2 mEq/L (ref 3.5–5.1)
Sodium: 137 mEq/L (ref 135–145)
Total Bilirubin: 0.6 mg/dL (ref 0.2–1.2)
Total Protein: 7.8 g/dL (ref 6.0–8.3)

## 2021-08-31 LAB — LIPID PANEL
Cholesterol: 194 mg/dL (ref 0–200)
HDL: 47.2 mg/dL (ref >39.00)
LDL Cholesterol: 131 mg/dL — ABNORMAL HIGH (ref 0–99)
NonHDL: 147.08
Total CHOL/HDL Ratio: 4
Triglycerides: 78 mg/dL (ref 0.0–149.0)
VLDL: 15.6 mg/dL (ref 0.0–40.0)

## 2021-08-31 LAB — C-REACTIVE PROTEIN: CRP: <1 mg/dL (ref 0.5–20.0)

## 2021-08-31 LAB — TSH: TSH: 1.92 u[IU]/mL (ref 0.35–5.50)

## 2021-08-31 LAB — CK: Total CK: 35 U/L (ref 7–177)

## 2021-08-31 LAB — SEDIMENTATION RATE: Sed Rate: 13 mm/hr (ref 0–20)

## 2021-08-31 NOTE — Patient Instructions (Signed)
It was very nice to see you today!  Take ibuprofen 600mg  three times/day for 5-7 days.  Stretches.  Massage.  Kneaded energy.   PLEASE NOTE:  If you had any lab tests please let know if you have not heard back within a few days. You may see your results on MyChart before we have a chance to review them but we will give you a call once they are reviewed by [place]. If we ordered any referrals today, please let us know if you have not heard from their office within the next week.   Please try these tips to maintain a healthy lifestyle:  Eat most of your calories during the day when you are active. Eliminate processed foods including packaged sweets (pies, cakes, cookies), reduce intake of potatoes, white bread, white pasta, and white rice. Look for whole grain options, oat flour or almond flour.  Each meal should contain half fruits/vegetables, one quarter protein, and one quarter carbs (no bigger than a computer mouse).  Cut down on sweet beverages. This includes juice, soda, and sweet tea. Also watch fruit intake, though this is a healthier sweet option, it still contains natural sugar! Limit to 3 servings daily.  Drink at least 1 glass of water with each meal and aim for at least 8 glasses per day  Exercise at least 150 minutes every week.

## 2021-08-31 NOTE — Progress Notes (Signed)
Subjective:     Patient ID: Anne Matthews, female    DOB: 1996/03/08, 26 y.o.   MRN: [id number]  Chief Complaint  Patient presents with   Generalized Body Aches    Started a couple of weeks ago, getting worse over the last few days, mostly back muscles   Shoulder Pain    Right shoulder pain, ongoing, better today than it was yesterday Took Tylenol yesterday for pain, nothing today     HPI-here w/mom Past1-2, generalized aches-more back.  Moving boxes more, groceries. Waking up tired and achey.  No f/c.  Not feeling sick. Off psych meds around May 5-weaned Luvox. Movement not worse.  Feels like something "stuck in throat".  Just achey-no weakness.  Tylenol helps some.  LMP 5/6  There are no preventive care reminders to display for this patient.  Past Medical History:  Diagnosis Date   ADHD (attention deficit hyperactivity disorder)    Allergy    Anxiety    [employer] 12/20/2019   Depression    [employer] 12/20/2019    Past Surgical History:  Procedure Laterality Date   BACK SURGERY  2018   2 VERTEBRA CRUSHED FROM FALL OFF HORSE   TYMPANOSTOMY TUBE PLACEMENT     child   WISDOM TOOTH EXTRACTION  2019    Outpatient Medications Prior to Visit  Medication Sig Dispense Refill   ALPRAZolam (XANAX) 1 MG tablet Take 1 mg by mouth 2 (two) times daily as needed.     Dexmethylphenidate HCl 30 MG CP24 Take 1 capsule by mouth daily.     fluticasone (FLONASE) 50 MCG/ACT nasal spray Place 1 spray into both nostrils daily. 16 g 3   guanFACINE (INTUNIV) 1 MG TB24 ER tablet Take 1 mg by mouth daily.     loratadine (CLARITIN) 10 MG tablet Take 10 mg by mouth daily.     No facility-administered medications prior to visit.    No Known Allergies ROS neg/noncontributory except as noted HPI/below      Objective:     BP 102/60   Pulse 90   Temp 98.6 F (37 C) (Temporal)   Ht '5\' 4"'  (1.626 m)   Wt 190 lb 8 oz (86.4 kg)   LMP 08/11/2021 (Exact Date)   SpO2 98%   BMI 32.70  kg/m  Wt Readings from Last 3 Encounters:  08/31/21 190 lb 8 oz (86.4 kg)  08/21/21 189 lb 2 oz (85.8 kg)  12/20/19 159 lb (72.1 kg)    Physical Exam   Gen: WDWN NAD HEENT: NCAT, conjunctiva not injected, sclera nonicteric TM WNL B, OP moist, no exudates  NECK:  supple, ? Slight thyromegaly, no nodes, no carotid bruits. Some sl tenderness at top of ant neck CARDIAC: RRR, S1S2+, no murmur. DP 2+B LUNGS: CTAB. No wheezes ABDOMEN:  BS+, soft, NTND, No HSM, no masses EXT:  no edema MSK: no gross abnormalities. MS 5/5 all 4.  No ttp back but shoulders tender and tight and spasms NEURO: A&O x3.  CN II-XII intact.  PSYCH: normal mood. Good eye contact     Assessment & Plan:   Problem List Items Addressed This Visit   None Visit Diagnoses     Myalgia    -  Primary   Relevant Orders   Comprehensive metabolic panel   Lipid panel   TSH   CBC with Differential/Platelet   CK   C-reactive protein   Sedimentation rate      Myalgias-not sure if viral, meds withdrawals(but weaned  sev wks ago), chemical, stress, other.  Hasn't done labs long time so will check cmp, lip, tsh, cbc, ck, crp, esr.  Ibu 600 tid x 5-7 days,  stretches.  Massage.  New symptoms, worsening, etc. Let me know.   No orders of the defined types were placed in this encounter.   Wellington Hampshire, MD

## 2021-09-20 ENCOUNTER — Encounter: Payer: Self-pay | Admitting: Family Medicine

## 2021-12-28 ENCOUNTER — Ambulatory Visit: Payer: BC Managed Care – PPO | Admitting: Family Medicine

## 2021-12-28 ENCOUNTER — Encounter: Payer: Self-pay | Admitting: Family Medicine

## 2021-12-28 VITALS — BP 124/70 | HR 96 | Temp 98.6°F | Ht 64.0 in | Wt 180.5 lb

## 2021-12-28 DIAGNOSIS — F4320 Adjustment disorder, unspecified: Secondary | ICD-10-CM

## 2021-12-28 DIAGNOSIS — Z7185 Encounter for immunization safety counseling: Secondary | ICD-10-CM

## 2021-12-28 DIAGNOSIS — Z23 Encounter for immunization: Secondary | ICD-10-CM | POA: Diagnosis not present

## 2021-12-28 NOTE — Patient Instructions (Signed)
It was very nice to see you today!  GOOD LUCK!!!!!!!!!!!!!   PLEASE NOTE:  If you had any lab tests please let us know if you have not heard back within a few days. You may see your results on MyChart before we have a chance to review them but we will give you a call once they are reviewed by [place]. If we ordered any referrals today, please let us know if you have not heard from their office within the next week.   Please try these tips to maintain a healthy lifestyle:  Eat most of your calories during the day when you are active. Eliminate processed foods including packaged sweets (pies, cakes, cookies), reduce intake of potatoes, white bread, white pasta, and white rice. Look for whole grain options, oat flour or almond flour.  Each meal should contain half fruits/vegetables, one quarter protein, and one quarter carbs (no bigger than a computer mouse).  Cut down on sweet beverages. This includes juice, soda, and sweet tea. Also watch fruit intake, though this is a healthier sweet option, it still contains natural sugar! Limit to 3 servings daily.  Drink at least 1 glass of water with each meal and aim for at least 8 glasses per day  Exercise at least 150 minutes every week.

## 2021-12-28 NOTE — Progress Notes (Unsigned)
Subjective:     Patient ID: Anne Matthews, female    DOB: March 08, 1996, 26 y.o.   MRN: [id number]  Chief Complaint  Patient presents with   Discuss Vaccines    Going to [facility] want to discuss vaccines     HPI Going to [place] for work on 10/3.  Taking care of corals. Will be gone at least 1 yr or permanently.   Needs to get vaccines.    ADD/adjustment disorder-managed by psych-doing well. No SI  Health Maintenance Due  Topic Date Due   HPV VACCINES (3 - 2-dose series) 02/29/2012   Hepatitis C Screening  Never done   PAP-Cervical Cytology Screening  Never done   PAP SMEAR-Modifier  Never done   COVID-19 Vaccine (4 - Pfizer series) 05/11/2020    Past Medical History:  Diagnosis Date   ADHD (attention deficit hyperactivity disorder)    Allergy    Anxiety    Phreesia 12/20/2019   Depression    Phreesia 12/20/2019    Past Surgical History:  Procedure Laterality Date   BACK SURGERY  2018   2 VERTEBRA CRUSHED FROM FALL OFF HORSE   TYMPANOSTOMY TUBE PLACEMENT     child   WISDOM TOOTH EXTRACTION  2019    Outpatient Medications Prior to Visit  Medication Sig Dispense Refill   ALPRAZolam (XANAX) 1 MG tablet Take 1 mg by mouth 2 (two) times daily as needed.     BIOTIN PO Take by mouth.     buPROPion (WELLBUTRIN XL) 300 MG 24 hr tablet Take 300 mg by mouth daily.     Dexmethylphenidate HCl 30 MG CP24 Take 1 capsule by mouth daily.     fluticasone (FLONASE) 50 MCG/ACT nasal spray Place 1 spray into both nostrils daily. 16 g 3   loratadine (CLARITIN) 10 MG tablet Take 1 tablet by mouth daily.     Oxymetazoline HCl (AFRIN NASAL SPRAY NA) Place into the nose.     guanFACINE (INTUNIV) 1 MG TB24 ER tablet Take 1 mg by mouth daily.     loratadine (CLARITIN) 10 MG tablet Take 10 mg by mouth daily.     No facility-administered medications prior to visit.    Allergies  Allergen Reactions   Levonorgestrel-Ethinyl Estrad Other (See Comments)   ROS  neg/noncontributory except as noted HPI/below      Objective:     BP 124/70   Pulse 96   Temp 98.6 F (37 C) (Temporal)   Ht 5\' 4"  (1.626 m)   Wt 180 lb 8 oz (81.9 kg)   LMP 12/28/2021 (Exact Date)   SpO2 97%   BMI 30.98 kg/m  Wt Readings from Last 3 Encounters:  12/28/21 180 lb 8 oz (81.9 kg)  08/31/21 190 lb 8 oz (86.4 kg)  08/21/21 189 lb 2 oz (85.8 kg)    Physical Exam   Gen: WDWN NAD HEENT: NCAT, conjunctiva not injected, sclera nonicteric CARDIAC: RRR, S1S2+, no murmur. LUNGS: CTAB. No wheezes EXT:  no edema MSK: no gross abnormalities.  NEURO: A&O x3.  CN II-XII intact.  PSYCH: normal mood. Good eye contact     Assessment & Plan:   Problem List Items Addressed This Visit       Other   Adjustment reaction of adolescence - Primary   Other Visit Diagnoses     Need for hepatitis A vaccination       Relevant Orders   Hepatitis A vaccine adult IM (Completed)   Need for immunization against  influenza       Relevant Orders   Flu Vaccine QUAD 23mo+IM (Fluarix, Fluzone & Alfiuria Quad PF) (Completed)   Encounter for counseling regarding immunization          AHDH/adjustment rxn-chronic. Controlled on meds.  Managed by psych Counseling for moving to [place]-pt needs Gardasil #3 but since needing so many other immunizations by 10/3, will defer till returns in December.  Will do Hep A and flu today(needs to get #2 hep A 86mo).  She will get typhoid at pharm and Covid booster(if avail before leaves).    No orders of the defined types were placed in this encounter.   Wellington Hampshire, MD

## 2022-01-02 ENCOUNTER — Other Ambulatory Visit: Payer: Self-pay | Admitting: *Deleted

## 2022-01-02 ENCOUNTER — Encounter: Payer: Self-pay | Admitting: Family Medicine

## 2022-01-02 MED ORDER — TYPHOID VACCINE PO CPDR: 1.0000 | DELAYED_RELEASE_CAPSULE | ORAL | 0 refills | Status: DC

## 2022-03-01 ENCOUNTER — Encounter: Payer: BC Managed Care – PPO | Admitting: Family Medicine

## 2022-03-26 ENCOUNTER — Encounter: Payer: Self-pay | Admitting: Family Medicine

## 2022-03-26 ENCOUNTER — Ambulatory Visit: Payer: BC Managed Care – PPO | Admitting: Family Medicine

## 2022-03-26 VITALS — BP 110/72 | HR 100 | Temp 97.9°F | Ht 64.0 in | Wt 172.4 lb

## 2022-03-26 DIAGNOSIS — R591 Generalized enlarged lymph nodes: Secondary | ICD-10-CM

## 2022-03-26 DIAGNOSIS — Z23 Encounter for immunization: Secondary | ICD-10-CM

## 2022-03-26 MED ORDER — CLINDAMYCIN PHOSPHATE 1 % EX GEL: Freq: Two times a day (BID) | CUTANEOUS | 3 refills | Status: AC

## 2022-03-26 NOTE — Progress Notes (Signed)
Subjective:     Patient ID: Anne Matthews, female    DOB: Aug 17, 1995, 26 y.o.   MRN: [id number]  Chief Complaint  Patient presents with   Adenopathy    Lymph node under right arm swollen, have noticed 3 times recently, pain with certain movement    HPI Lymph node R axilla. Will get 1-2 days and then disappears.  Then gone for few months.  Then will "fill up" but gone.  Now filling again past 2 days. This is 3rd time this month.  No d/c.  Sl tender w/arm movement.  No f/c.  No breast tenderness. No lumps, no nipple d/c.  No on ocp.  Not related to menses.     Health Maintenance Due  Topic Date Due   Hepatitis C Screening  Never done   PAP-Cervical Cytology Screening  Never done   PAP SMEAR-Modifier  Never done    Past Medical History:  Diagnosis Date   ADHD (attention deficit hyperactivity disorder)    Allergy    Anxiety    Phreesia 12/20/2019   Depression    [employer]reesia 12/20/2019    Past Surgical History:  Procedure Laterality Date   BACK SURGERY  2018   2 VERTEBRA CRUSHED FROM FALL OFF HORSE   TYMPANOSTOMY TUBE PLACEMENT     child   WISDOM TOOTH EXTRACTION  2019    Outpatient Medications Prior to Visit  Medication Sig Dispense Refill   ALPRAZolam (XANAX) 1 MG tablet Take 1 mg by mouth 2 (two) times daily as needed.     BIOTIN PO Take by mouth.     buPROPion (WELLBUTRIN XL) 300 MG 24 hr tablet Take 300 mg by mouth daily.     Dexmethylphenidate HCl 30 MG CP24 Take 1 capsule by mouth daily.     fluticasone (FLONASE) 50 MCG/ACT nasal spray Place 1 spray into both nostrils daily. 16 g 3   loratadine (CLARITIN) 10 MG tablet Take 1 tablet by mouth daily.     Oxymetazoline HCl (AFRIN NASAL SPRAY NA) Place into the nose as needed.     typhoid (VIVOTIF) DR capsule Take 1 capsule by mouth every other day. 4 capsule 0   No facility-administered medications prior to visit.    Allergies  Allergen Reactions   Levonorgestrel-Ethinyl Estrad Other (See Comments)   ROS  neg/noncontributory except as noted HPI/below HA behind R eye. Gpa had cluster ha. Dull-sharp pain.  Was getting after menses, but now, more since back from [place].       Objective:     BP 110/72   Pulse 100   Temp 97.9 F (36.6 C) (Temporal)   Ht 5\' 4"  (1.626 m)   Wt 172 lb 6 oz (78.2 kg)   LMP 03/16/2022 (Exact Date)   SpO2 99%   BMI 29.59 kg/m  Wt Readings from Last 3 Encounters:  03/26/22 172 lb 6 oz (78.2 kg)  12/28/21 180 lb 8 oz (81.9 kg)  08/31/21 190 lb 8 oz (86.4 kg)    Physical Exam   Gen: WDWN NAD HEENT: NCAT, conjunctiva not injected, sclera nonicteric NECK:  supple, no thyromegaly, no nodes, no carotid bruits OP normal CARDIAC: RRR, S1S2+, no murmur.  ABDOMEN:  BS+, soft, NTND, No HSM, no masses EXT:  no edema MSK: no gross abnormalities.  NEURO: A&O x3.  CN II-XII intact.  PSYCH: normal mood. Good eye contact B axilla no nodes.  Poss could feel pea sized nodule in R axilla but not sure. Some acne  chest and back.       Assessment & Plan:   Problem List Items Addressed This Visit   None Visit Diagnoses     Lymphadenopathy    -  Primary   Need for HPV vaccination       Relevant Orders   HPV 9-valent vaccine,Recombinat (Completed)      Lymphadenopathy vs HS or other.  Pt wants to hold on u/s axilla.  Will do rx for clindagel if recurs.  Will montitor for now.  She will be gone Jan and Feb and back.  If bad pain, draining, not resolve in 10 days, she will be seen there or sch here.    HA-poss weather/environmental change-tylenol, nsaids.  Monitor.   Meds ordered this encounter  Medications   clindamycin (CLINDAGEL) 1 % gel    Sig: Apply topically 2 (two) times daily.    Dispense:  30 g    Refill:  3    Angelena Sole, MD

## 2022-03-26 NOTE — Patient Instructions (Signed)
It was very nice to see you today! ° °Merry Christmas ° ° °PLEASE NOTE: ° °If you had any lab tests please let us know if you have not heard back within a few days. You may see your results on MyChart before we have a chance to review them but we will give you a call once they are reviewed by us. If we ordered any referrals today, please let us know if you have not heard from their office within the next week.  ° °Please try these tips to maintain a healthy lifestyle: ° °Eat most of your calories during the day when you are active. Eliminate processed foods including packaged sweets (pies, cakes, cookies), reduce intake of potatoes, white bread, white pasta, and white rice. Look for whole grain options, oat flour or almond flour. ° °Each meal should contain half fruits/vegetables, one quarter protein, and one quarter carbs (no bigger than a computer mouse). ° °Cut down on sweet beverages. This includes juice, soda, and sweet tea. Also watch fruit intake, though this is a healthier sweet option, it still contains natural sugar! Limit to 3 servings daily. ° °Drink at least 1 glass of water with each meal and aim for at least 8 glasses per day ° °Exercise at least 150 minutes every week.   °

## 2022-06-18 ENCOUNTER — Telehealth: Payer: Self-pay | Admitting: Family Medicine

## 2022-06-18 NOTE — Telephone Encounter (Signed)
See below and advise

## 2022-06-18 NOTE — Telephone Encounter (Signed)
Patient [place] is [place]. Requests medical certification that Patient is clear to dive. 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  Requests to be called for status.

## 2022-06-19 ENCOUNTER — Encounter: Payer: Self-pay | Admitting: *Deleted

## 2022-06-19 NOTE — Telephone Encounter (Signed)
Letter completed and sent to mychart. Patient notified and verbalized understanding.

## 2023-01-10 HISTORY — PX: TOENAIL EXCISION: SUR558

## 2023-04-03 ENCOUNTER — Ambulatory Visit: Payer: Self-pay | Admitting: Podiatry

## 2023-04-03 ENCOUNTER — Encounter: Payer: Self-pay | Admitting: Podiatry

## 2023-04-03 DIAGNOSIS — L600 Ingrowing nail: Secondary | ICD-10-CM

## 2023-04-03 NOTE — Progress Notes (Signed)
Subjective:   Patient ID: Anne Matthews, female   DOB: 27 y.o.   MRN: [id number]   HPI Patient presents with mother with painful ingrown toenail of the right big toe stating all the other ones are doing well   ROS      Objective:  Physical Exam  There are status intact with incurvated lateral border right big toenail with all of the nailbeds doing well chronic ingrown     Assessment:  Toenail deformity right hallux also has small wart formation     Plan:  H&P reviewed recommended correction of the nail border explained procedure risk and she signed consent form.  I infiltrated the right big toe 60 mg Xylocaine Marcaine mixture sterile prep done using sterile instrumentation remove the lateral border exposed matrix applied phenol 3 applications 30 seconds followed by alcohol lavage sterile dressing gave instructions on soaks and to wear dressing 24 hours taken off earlier if throbbing were to occur and call with questions and applied a small amount of immune agent to the warts and advised what to do if any blistering were to occur

## 2023-04-03 NOTE — Patient Instructions (Signed)
